# Patient Record
Sex: Female | Born: 1945 | Race: White | Hispanic: No | Marital: Single | Smoking: Never smoker
Health system: Southern US, Community
[De-identification: ages and names within clinical notes are randomized; demographics above are authoritative.]

## PROBLEM LIST (undated history)

## (undated) DIAGNOSIS — I82409 Acute embolism and thrombosis of unspecified deep veins of unspecified lower extremity: Secondary | ICD-10-CM

## (undated) DIAGNOSIS — D689 Coagulation defect, unspecified: Secondary | ICD-10-CM

## (undated) DIAGNOSIS — E785 Hyperlipidemia, unspecified: Secondary | ICD-10-CM

## (undated) DIAGNOSIS — T7840XA Allergy, unspecified, initial encounter: Secondary | ICD-10-CM

## (undated) DIAGNOSIS — M1990 Unspecified osteoarthritis, unspecified site: Secondary | ICD-10-CM

## (undated) HISTORY — DX: Allergy, unspecified, initial encounter: T78.40XA

## (undated) HISTORY — PX: TONSILLECTOMY: SUR1361

## (undated) HISTORY — DX: Unspecified osteoarthritis, unspecified site: M19.90

## (undated) HISTORY — DX: Hyperlipidemia, unspecified: E78.5

## (undated) HISTORY — PX: WRIST FRACTURE SURGERY: SHX121

## (undated) HISTORY — DX: Coagulation defect, unspecified: D68.9

## (undated) HISTORY — DX: Acute embolism and thrombosis of unspecified deep veins of unspecified lower extremity: I82.409

---

## 2000-01-01 ENCOUNTER — Other Ambulatory Visit: Admission: RE | Admit: 2000-01-01 | Discharge: 2000-01-01 | Payer: Self-pay | Admitting: Family Medicine

## 2000-01-09 ENCOUNTER — Encounter: Admission: RE | Admit: 2000-01-09 | Discharge: 2000-01-09 | Payer: Self-pay | Admitting: Family Medicine

## 2000-01-09 ENCOUNTER — Encounter: Payer: Self-pay | Admitting: Family Medicine

## 2000-01-12 ENCOUNTER — Encounter: Payer: Self-pay | Admitting: Family Medicine

## 2000-01-12 ENCOUNTER — Encounter: Admission: RE | Admit: 2000-01-12 | Discharge: 2000-01-12 | Payer: Self-pay | Admitting: Family Medicine

## 2000-07-22 ENCOUNTER — Encounter: Admission: RE | Admit: 2000-07-22 | Discharge: 2000-07-22 | Payer: Self-pay | Admitting: Family Medicine

## 2000-07-22 ENCOUNTER — Encounter: Payer: Self-pay | Admitting: Family Medicine

## 2001-02-11 ENCOUNTER — Other Ambulatory Visit: Admission: RE | Admit: 2001-02-11 | Discharge: 2001-02-11 | Payer: Self-pay | Admitting: Family Medicine

## 2001-08-21 ENCOUNTER — Encounter: Admission: RE | Admit: 2001-08-21 | Discharge: 2001-08-21 | Payer: Self-pay | Admitting: Family Medicine

## 2001-08-21 ENCOUNTER — Encounter: Payer: Self-pay | Admitting: Family Medicine

## 2002-02-17 ENCOUNTER — Other Ambulatory Visit: Admission: RE | Admit: 2002-02-17 | Discharge: 2002-02-17 | Payer: Self-pay | Admitting: Family Medicine

## 2002-07-29 ENCOUNTER — Encounter: Admission: RE | Admit: 2002-07-29 | Discharge: 2002-07-29 | Payer: Self-pay | Admitting: Family Medicine

## 2002-07-29 ENCOUNTER — Encounter: Payer: Self-pay | Admitting: Family Medicine

## 2003-03-12 ENCOUNTER — Other Ambulatory Visit: Admission: RE | Admit: 2003-03-12 | Discharge: 2003-03-12 | Payer: Self-pay | Admitting: Family Medicine

## 2003-04-12 ENCOUNTER — Encounter: Admission: RE | Admit: 2003-04-12 | Discharge: 2003-04-12 | Payer: Self-pay | Admitting: Family Medicine

## 2003-04-12 ENCOUNTER — Encounter: Payer: Self-pay | Admitting: Family Medicine

## 2004-05-11 ENCOUNTER — Encounter: Admission: RE | Admit: 2004-05-11 | Discharge: 2004-05-11 | Payer: Self-pay | Admitting: Family Medicine

## 2005-05-16 ENCOUNTER — Encounter: Admission: RE | Admit: 2005-05-16 | Discharge: 2005-05-16 | Payer: Self-pay | Admitting: Family Medicine

## 2005-12-11 ENCOUNTER — Other Ambulatory Visit: Admission: RE | Admit: 2005-12-11 | Discharge: 2005-12-11 | Payer: Self-pay | Admitting: Family Medicine

## 2006-05-21 ENCOUNTER — Encounter: Admission: RE | Admit: 2006-05-21 | Discharge: 2006-05-21 | Payer: Self-pay | Admitting: Family Medicine

## 2006-06-25 HISTORY — PX: COLONOSCOPY: SHX174

## 2007-01-06 ENCOUNTER — Other Ambulatory Visit: Admission: RE | Admit: 2007-01-06 | Discharge: 2007-01-06 | Payer: Self-pay | Admitting: Family Medicine

## 2007-02-03 ENCOUNTER — Ambulatory Visit: Payer: Self-pay | Admitting: Internal Medicine

## 2007-02-17 ENCOUNTER — Encounter: Payer: Self-pay | Admitting: Internal Medicine

## 2007-02-17 ENCOUNTER — Ambulatory Visit: Payer: Self-pay | Admitting: Internal Medicine

## 2007-05-27 ENCOUNTER — Encounter: Admission: RE | Admit: 2007-05-27 | Discharge: 2007-05-27 | Payer: Self-pay | Admitting: Family Medicine

## 2008-04-16 ENCOUNTER — Other Ambulatory Visit: Admission: RE | Admit: 2008-04-16 | Discharge: 2008-04-16 | Payer: Self-pay | Admitting: Family Medicine

## 2008-05-27 ENCOUNTER — Encounter: Admission: RE | Admit: 2008-05-27 | Discharge: 2008-05-27 | Payer: Self-pay | Admitting: Family Medicine

## 2008-06-25 DIAGNOSIS — I82409 Acute embolism and thrombosis of unspecified deep veins of unspecified lower extremity: Secondary | ICD-10-CM

## 2008-06-25 HISTORY — DX: Acute embolism and thrombosis of unspecified deep veins of unspecified lower extremity: I82.409

## 2008-09-06 ENCOUNTER — Encounter: Admission: RE | Admit: 2008-09-06 | Discharge: 2008-09-06 | Payer: Self-pay | Admitting: Family Medicine

## 2009-03-06 ENCOUNTER — Encounter (INDEPENDENT_AMBULATORY_CARE_PROVIDER_SITE_OTHER): Payer: Self-pay | Admitting: Emergency Medicine

## 2009-03-06 ENCOUNTER — Inpatient Hospital Stay (HOSPITAL_COMMUNITY): Admission: EM | Admit: 2009-03-06 | Discharge: 2009-03-12 | Payer: Self-pay | Admitting: Emergency Medicine

## 2009-03-06 ENCOUNTER — Ambulatory Visit: Payer: Self-pay | Admitting: Vascular Surgery

## 2009-03-14 ENCOUNTER — Ambulatory Visit: Payer: Self-pay | Admitting: Internal Medicine

## 2009-03-15 DIAGNOSIS — E785 Hyperlipidemia, unspecified: Secondary | ICD-10-CM | POA: Insufficient documentation

## 2009-04-12 LAB — CBC WITH DIFFERENTIAL/PLATELET
BASO%: 0.4 % (ref 0.0–2.0)
EOS%: 2.6 % (ref 0.0–7.0)
HCT: 38.7 % (ref 34.8–46.6)
HGB: 13.1 g/dL (ref 11.6–15.9)
MCH: 31.9 pg (ref 25.1–34.0)
MCHC: 34 g/dL (ref 31.5–36.0)
MONO#: 0.5 10*3/uL (ref 0.1–0.9)
NEUT#: 4.9 10*3/uL (ref 1.5–6.5)
NEUT%: 65.5 % (ref 38.4–76.8)
RBC: 4.12 10*6/uL (ref 3.70–5.45)
lymph#: 1.8 10*3/uL (ref 0.9–3.3)

## 2009-04-12 LAB — COMPREHENSIVE METABOLIC PANEL
ALT: 25 U/L (ref 0–35)
Alkaline Phosphatase: 64 U/L (ref 39–117)
CO2: 22 mEq/L (ref 19–32)
Creatinine, Ser: 0.75 mg/dL (ref 0.40–1.20)
Glucose, Bld: 96 mg/dL (ref 70–99)
Sodium: 141 mEq/L (ref 135–145)
Total Bilirubin: 0.5 mg/dL (ref 0.3–1.2)

## 2009-04-12 LAB — PROTIME-INR: Protime: 37.2 Seconds — ABNORMAL HIGH (ref 10.6–13.4)

## 2009-04-12 LAB — APTT: aPTT: 35 seconds (ref 24–37)

## 2009-04-14 LAB — URINE CULTURE

## 2009-04-18 LAB — HYPERCOAGULABLE PANEL, COMPREHENSIVE RET.
Anticardiolipin IgA: <3 APL U/mL (ref <10)
Anticardiolipin IgG: 6 GPL U/mL (ref <10)
Anticardiolipin IgM: <10 MPL U/mL (ref <10)
Beta-2 Glyco I IgG: 13 U/mL (ref <=15)
Beta-2-Glycoprotein I IgA: <3 U/mL (ref <=15)
Beta-2-Glycoprotein I IgM: 61 U/mL — ABNORMAL HIGH (ref <=15)
PTT Lupus Anticoagulant: 57.7 secs — ABNORMAL HIGH (ref 32.0–43.4)
PTTLA 4:1 Mix: 45.7 secs (ref 36.3–48.8)
PTTLA Confirmation: 0 secs (ref <8.0)

## 2009-05-05 ENCOUNTER — Ambulatory Visit: Payer: Self-pay | Admitting: Internal Medicine

## 2009-05-30 ENCOUNTER — Encounter: Admission: RE | Admit: 2009-05-30 | Discharge: 2009-05-30 | Payer: Self-pay | Admitting: Family Medicine

## 2009-08-18 DIAGNOSIS — M899 Disorder of bone, unspecified: Secondary | ICD-10-CM | POA: Insufficient documentation

## 2010-05-31 ENCOUNTER — Encounter
Admission: RE | Admit: 2010-05-31 | Discharge: 2010-05-31 | Payer: Self-pay | Source: Home / Self Care | Admitting: Internal Medicine

## 2010-09-29 LAB — HEPARIN LEVEL (UNFRACTIONATED)
Heparin Unfractionated: 0.41 IU/mL (ref 0.30–0.70)
Heparin Unfractionated: <0.1 IU/mL — ABNORMAL LOW (ref 0.30–0.70)
Heparin Unfractionated: 0.25 IU/mL — ABNORMAL LOW (ref 0.30–0.70)

## 2010-09-29 LAB — COMPREHENSIVE METABOLIC PANEL
ALT: 19 U/L (ref 0–35)
Alkaline Phosphatase: 58 U/L (ref 39–117)
CO2: 28 mEq/L (ref 19–32)
GFR calc non Af Amer: >60 mL/min (ref >60)
Glucose, Bld: 107 mg/dL — ABNORMAL HIGH (ref 70–99)
Potassium: 3.6 mEq/L (ref 3.5–5.1)
Sodium: 137 mEq/L (ref 135–145)
Total Protein: 6.4 g/dL (ref 6.0–8.3)

## 2010-09-29 LAB — BASIC METABOLIC PANEL
BUN: 11 mg/dL (ref 6–23)
CO2: 22 mEq/L (ref 19–32)
Calcium: 7.4 mg/dL — ABNORMAL LOW (ref 8.4–10.5)
Chloride: 106 mEq/L (ref 96–112)
GFR calc Af Amer: >60 mL/min (ref >60)
GFR calc Af Amer: >60 mL/min (ref >60)
Potassium: 3.6 mEq/L (ref 3.5–5.1)
Potassium: 4.3 mEq/L (ref 3.5–5.1)
Sodium: 135 mEq/L (ref 135–145)

## 2010-09-29 LAB — PROTIME-INR
INR: 1.3 (ref 0.00–1.49)
Prothrombin Time: 15.8 seconds — ABNORMAL HIGH (ref 11.6–15.2)
Prothrombin Time: 17.3 seconds — ABNORMAL HIGH (ref 11.6–15.2)

## 2010-09-29 LAB — CBC
HCT: 30.8 % — ABNORMAL LOW (ref 36.0–46.0)
HCT: 32.2 % — ABNORMAL LOW (ref 36.0–46.0)
HCT: 32.4 % — ABNORMAL LOW (ref 36.0–46.0)
HCT: 37.3 % (ref 36.0–46.0)
HCT: 37.3 % (ref 36.0–46.0)
HCT: 37.9 % (ref 36.0–46.0)
Hemoglobin: 10.8 g/dL — ABNORMAL LOW (ref 12.0–15.0)
Hemoglobin: 11 g/dL — ABNORMAL LOW (ref 12.0–15.0)
MCHC: 33.7 g/dL (ref 30.0–36.0)
MCHC: 34.5 g/dL (ref 30.0–36.0)
MCHC: 34.1 g/dL (ref 30.0–36.0)
MCV: 94.2 fL (ref 78.0–100.0)
MCV: 94.1 fL (ref 78.0–100.0)
MCV: 94.7 fL (ref 78.0–100.0)
Platelets: 200 10*3/uL (ref 150–400)
Platelets: 166 10*3/uL (ref 150–400)
Platelets: 185 10*3/uL (ref 150–400)
Platelets: 200 10*3/uL (ref 150–400)
RBC: 3.17 MIL/uL — ABNORMAL LOW (ref 3.87–5.11)
RBC: 3.43 MIL/uL — ABNORMAL LOW (ref 3.87–5.11)
RBC: 3.96 MIL/uL (ref 3.87–5.11)
RDW: 13 % (ref 11.5–15.5)
WBC: 5.6 10*3/uL (ref 4.0–10.5)
WBC: 6.5 10*3/uL (ref 4.0–10.5)
WBC: 13.4 10*3/uL — ABNORMAL HIGH (ref 4.0–10.5)
WBC: 6.7 10*3/uL (ref 4.0–10.5)
WBC: 8.1 10*3/uL (ref 4.0–10.5)
WBC: 8.4 10*3/uL (ref 4.0–10.5)

## 2010-09-29 LAB — DIFFERENTIAL
Eosinophils Absolute: 0 10*3/uL (ref 0.0–0.7)
Eosinophils Relative: 0 % (ref 0–5)
Eosinophils Relative: 4 % (ref 0–5)
Lymphocytes Relative: 23 % (ref 12–46)
Lymphs Abs: 1.2 10*3/uL (ref 0.7–4.0)
Lymphs Abs: 1.5 10*3/uL (ref 0.7–4.0)
Monocytes Absolute: 0.7 10*3/uL (ref 0.1–1.0)
Monocytes Relative: 10 % (ref 3–12)
Monocytes Relative: 8 % (ref 3–12)

## 2010-09-29 LAB — FACTOR 5 LEIDEN

## 2010-09-29 LAB — D-DIMER, QUANTITATIVE: D-Dimer, Quant: 8.89 ug/mL-FEU — ABNORMAL HIGH (ref 0.00–0.48)

## 2010-09-29 LAB — HOMOCYSTEINE: Homocysteine: 5.9 umol/L (ref 4.0–15.4)

## 2011-04-27 ENCOUNTER — Other Ambulatory Visit: Payer: Self-pay | Admitting: Internal Medicine

## 2011-04-27 DIAGNOSIS — Z1231 Encounter for screening mammogram for malignant neoplasm of breast: Secondary | ICD-10-CM

## 2011-06-05 ENCOUNTER — Ambulatory Visit
Admission: RE | Admit: 2011-06-05 | Discharge: 2011-06-05 | Disposition: A | Discharge status: Home / Self Care | Payer: 59 | Source: Ambulatory Visit | Attending: Internal Medicine | Admitting: Internal Medicine

## 2011-06-05 DIAGNOSIS — Z1231 Encounter for screening mammogram for malignant neoplasm of breast: Secondary | ICD-10-CM

## 2012-05-06 ENCOUNTER — Other Ambulatory Visit: Payer: Self-pay | Admitting: Internal Medicine

## 2012-05-06 DIAGNOSIS — Z1231 Encounter for screening mammogram for malignant neoplasm of breast: Secondary | ICD-10-CM

## 2012-06-12 ENCOUNTER — Ambulatory Visit
Admission: RE | Admit: 2012-06-12 | Discharge: 2012-06-12 | Disposition: A | Discharge status: Home / Self Care | Payer: Medicare Other | Source: Ambulatory Visit | Attending: Internal Medicine | Admitting: Internal Medicine

## 2012-06-12 DIAGNOSIS — Z1231 Encounter for screening mammogram for malignant neoplasm of breast: Secondary | ICD-10-CM

## 2013-05-12 ENCOUNTER — Other Ambulatory Visit: Payer: Self-pay

## 2013-05-12 DIAGNOSIS — Z1231 Encounter for screening mammogram for malignant neoplasm of breast: Secondary | ICD-10-CM

## 2013-06-16 ENCOUNTER — Ambulatory Visit
Admission: RE | Admit: 2013-06-16 | Discharge: 2013-06-16 | Disposition: A | Discharge status: Home / Self Care | Payer: Medicare Other | Source: Ambulatory Visit

## 2013-06-16 DIAGNOSIS — Z1231 Encounter for screening mammogram for malignant neoplasm of breast: Secondary | ICD-10-CM

## 2013-09-15 DIAGNOSIS — M79672 Pain in left foot: Secondary | ICD-10-CM | POA: Insufficient documentation

## 2014-05-24 ENCOUNTER — Other Ambulatory Visit: Payer: Self-pay

## 2014-05-24 DIAGNOSIS — Z1231 Encounter for screening mammogram for malignant neoplasm of breast: Secondary | ICD-10-CM

## 2014-06-23 ENCOUNTER — Ambulatory Visit: Payer: Medicare Other

## 2014-06-30 ENCOUNTER — Ambulatory Visit
Admission: RE | Admit: 2014-06-30 | Discharge: 2014-06-30 | Disposition: A | Discharge status: Home / Self Care | Payer: Commercial Managed Care - HMO | Source: Ambulatory Visit

## 2014-06-30 DIAGNOSIS — Z1231 Encounter for screening mammogram for malignant neoplasm of breast: Secondary | ICD-10-CM | POA: Diagnosis not present

## 2014-09-27 DIAGNOSIS — L237 Allergic contact dermatitis due to plants, except food: Secondary | ICD-10-CM | POA: Diagnosis not present

## 2014-09-27 DIAGNOSIS — Z6825 Body mass index (BMI) 25.0-25.9, adult: Secondary | ICD-10-CM | POA: Diagnosis not present

## 2015-02-18 DIAGNOSIS — Z23 Encounter for immunization: Secondary | ICD-10-CM | POA: Diagnosis not present

## 2015-02-18 DIAGNOSIS — Z1389 Encounter for screening for other disorder: Secondary | ICD-10-CM | POA: Diagnosis not present

## 2015-02-18 DIAGNOSIS — E785 Hyperlipidemia, unspecified: Secondary | ICD-10-CM | POA: Diagnosis not present

## 2015-02-18 DIAGNOSIS — R7301 Impaired fasting glucose: Secondary | ICD-10-CM | POA: Diagnosis not present

## 2015-02-18 DIAGNOSIS — M859 Disorder of bone density and structure, unspecified: Secondary | ICD-10-CM | POA: Diagnosis not present

## 2015-02-18 DIAGNOSIS — Z6826 Body mass index (BMI) 26.0-26.9, adult: Secondary | ICD-10-CM | POA: Diagnosis not present

## 2015-02-18 DIAGNOSIS — R312 Other microscopic hematuria: Secondary | ICD-10-CM | POA: Diagnosis not present

## 2015-02-18 DIAGNOSIS — Z8672 Personal history of thrombophlebitis: Secondary | ICD-10-CM | POA: Diagnosis not present

## 2015-06-14 ENCOUNTER — Other Ambulatory Visit: Payer: Self-pay

## 2015-06-14 DIAGNOSIS — Z1231 Encounter for screening mammogram for malignant neoplasm of breast: Secondary | ICD-10-CM

## 2015-07-06 ENCOUNTER — Ambulatory Visit
Admission: RE | Admit: 2015-07-06 | Discharge: 2015-07-06 | Disposition: A | Discharge status: Home / Self Care | Payer: Commercial Managed Care - HMO | Source: Ambulatory Visit

## 2015-07-06 DIAGNOSIS — Z1231 Encounter for screening mammogram for malignant neoplasm of breast: Secondary | ICD-10-CM

## 2016-02-27 ENCOUNTER — Encounter (HOSPITAL_COMMUNITY): Payer: Self-pay | Admitting: Emergency Medicine

## 2016-02-27 ENCOUNTER — Emergency Department (HOSPITAL_COMMUNITY)
Admission: EM | Admit: 2016-02-27 | Discharge: 2016-02-27 | Disposition: A | Discharge status: Home / Self Care | Payer: Commercial Managed Care - HMO | Attending: Emergency Medicine | Admitting: Emergency Medicine

## 2016-02-27 ENCOUNTER — Emergency Department (HOSPITAL_COMMUNITY): Payer: Commercial Managed Care - HMO

## 2016-02-27 DIAGNOSIS — Y999 Unspecified external cause status: Secondary | ICD-10-CM | POA: Insufficient documentation

## 2016-02-27 DIAGNOSIS — W11XXXA Fall on and from ladder, initial encounter: Secondary | ICD-10-CM | POA: Diagnosis not present

## 2016-02-27 DIAGNOSIS — S6992XA Unspecified injury of left wrist, hand and finger(s), initial encounter: Secondary | ICD-10-CM | POA: Diagnosis present

## 2016-02-27 DIAGNOSIS — Y939 Activity, unspecified: Secondary | ICD-10-CM | POA: Diagnosis not present

## 2016-02-27 DIAGNOSIS — S52572A Other intraarticular fracture of lower end of left radius, initial encounter for closed fracture: Secondary | ICD-10-CM | POA: Diagnosis not present

## 2016-02-27 DIAGNOSIS — Y929 Unspecified place or not applicable: Secondary | ICD-10-CM | POA: Insufficient documentation

## 2016-02-27 DIAGNOSIS — S52502A Unspecified fracture of the lower end of left radius, initial encounter for closed fracture: Secondary | ICD-10-CM | POA: Insufficient documentation

## 2016-02-27 MED ORDER — FENTANYL CITRATE (PF) 100 MCG/2ML IJ SOLN
100.0000 ug | Freq: Once | INTRAMUSCULAR | Status: DC
  Filled 2016-02-27: qty 2

## 2016-02-27 MED ORDER — LIDOCAINE-EPINEPHRINE 1 %-1:100000 IJ SOLN
20.0000 mL | Freq: Once | INTRAMUSCULAR | Status: AC
  Administered 2016-02-27: 20 mL via INTRADERMAL

## 2016-02-27 MED ORDER — FENTANYL CITRATE (PF) 100 MCG/2ML IJ SOLN
50.0000 ug | Freq: Once | INTRAMUSCULAR | Status: AC
  Administered 2016-02-27: 50 ug via INTRAVENOUS
  Filled 2016-02-27: qty 2

## 2016-02-27 MED ORDER — MORPHINE SULFATE 15 MG PO TABS: 15.0000 mg | ORAL_TABLET | ORAL | 0 refills | Status: DC | PRN

## 2016-02-27 MED ORDER — ONDANSETRON HCL 4 MG/2ML IJ SOLN
4.0000 mg | Freq: Once | INTRAMUSCULAR | Status: AC
  Administered 2016-02-27: 4 mg via INTRAVENOUS
  Filled 2016-02-27: qty 2

## 2016-02-27 NOTE — ED Triage Notes (Signed)
Pt complaint of fall resulting in left wrist injury within an hour.

## 2016-02-27 NOTE — Discharge Instructions (Signed)

## 2016-02-27 NOTE — ED Notes (Signed)
Ortho tech applied finger traps with EDP input.  EDP requests hold to Fentanyl at this time.  RN will continue to assess for need.

## 2016-02-27 NOTE — ED Provider Notes (Signed)
[facility] DEPT Provider Note   CSN: [id number] Arrival date & time: 02/27/16  1157     History   Chief Complaint Chief Complaint  Patient presents with  . Fall  . Wrist Injury    HPI Stephanie Saunders is a 70 y.o. female.  70 yo F with a chief complaint of left wrist pain. This after she fell 2 steps off of ladder. Mechanical fall. Denies syncope. Denies headache. Denies chest pain shortness breath abdominal pain back pain. Pain to the left wrist worse with movement palpation. Throbbing sharp pain. Denies radiation.   The history is provided by the patient and a relative.  Fall  This is a new problem. The current episode started 1 to 2 hours ago. The problem occurs constantly. The problem has not changed since onset.Pertinent negatives include no chest pain, no headaches and no shortness of breath. The symptoms are aggravated by bending and twisting. Nothing relieves the symptoms. She has tried nothing for the symptoms. The treatment provided no relief.  Wrist Injury   Pertinent negatives include no fever.    Past Medical History:  Diagnosis Date  . DVT (deep vein thrombosis) in pregnancy     There are no active problems to display for this patient.   History reviewed. No pertinent surgical history.  OB History    No data available       Home Medications    Prior to Admission medications   Medication Sig Start Date End Date Taking? Authorizing Provider  morphine (MSIR) 15 MG tablet Take 1 tablet (15 mg total) by mouth every 4 (four) hours as needed for severe pain. 02/27/16   Deno Etienne, DO    Family History No family history on file.  Social History Social History  Substance Use Topics  . Smoking status: Never Smoker  . Smokeless tobacco: Not on file  . Alcohol use No     Allergies   Review of patient's allergies indicates no known allergies.   Review of Systems Review of Systems  Constitutional: Negative for chills and fever.  HENT: Negative for  congestion and rhinorrhea.   Eyes: Negative for redness and visual disturbance.  Respiratory: Negative for shortness of breath and wheezing.   Cardiovascular: Negative for chest pain and palpitations.  Gastrointestinal: Negative for nausea and vomiting.  Genitourinary: Negative for dysuria and urgency.  Musculoskeletal: Positive for arthralgias and myalgias.  Skin: Negative for pallor and wound.  Neurological: Negative for dizziness and headaches.     Physical Exam Updated Vital Signs BP 131/100 (BP Location: Right Arm)   Pulse 96   Temp 97.4 F (36.3 C) (Oral)   Resp 18   Ht 5\' 7"  (1.702 m)   Wt 160 lb (72.6 kg)   SpO2 96%   BMI 25.06 kg/m   Physical Exam  Constitutional: She is oriented to person, place, and time. She appears well-developed and well-nourished. No distress.  HENT:  Head: Normocephalic and atraumatic.  Eyes: EOM are normal. Pupils are equal, round, and reactive to light.  Neck: Normal range of motion. Neck supple.  Cardiovascular: Normal rate and regular rhythm.  Exam reveals no gallop and no friction rub.   No murmur heard. Pulmonary/Chest: Effort normal. She has no wheezes. She has no rales.  Abdominal: Soft. She exhibits no distension. There is no tenderness.  Musculoskeletal: She exhibits tenderness and deformity. She exhibits no edema.  Dorsally displaced distal radius.  PMS intact distally  Neurological: She is alert and oriented to person,  place, and time.  Skin: Skin is warm and dry. She is not diaphoretic.  Psychiatric: She has a normal mood and affect. Her behavior is normal.  Nursing note and vitals reviewed.    ED Treatments / Results  Labs (all labs ordered are listed, but only abnormal results are displayed) Labs Reviewed - No data to display  EKG  EKG Interpretation None       Radiology Dg Wrist Complete Left  Result Date: 02/27/2016 CLINICAL DATA:  Post reduction of LEFT wrist EXAM: LEFT WRIST - COMPLETE 3+ VIEW COMPARISON:   Earlier study of 02/27/2016 FINDINGS: Fiberglass cast now present. Nondisplaced ulnar styloid fracture. Intra-articular fracture of the distal LEFT radial metaphysis with persistent dorsal tilt of the distal radial articular surface and mild dorsal displacement. Alignment appears little changed. No new focal bony abnormalities identified. IMPRESSION: Little interval change in appearance of previously identified ulnar styloid and displaced intra-articular distal LEFT radial metaphyseal fractures. Electronically Signed   By: Lavonia Dana M.D.   On: 02/27/2016 14:39   Dg Wrist Complete Left  Result Date: 02/27/2016 CLINICAL DATA:  Fall off ladder, landing on left wrist, severe left wrist pain, obvious deformity EXAM: LEFT WRIST - COMPLETE 3+ VIEW COMPARISON:  None. FINDINGS: Note: Study incorrectly marked with the "RIGHT" marker. However, the tech and referring physician confirmed that the LEFT wrist was imaged prior to completing the study. Comminuted, intra-articular distal radial fracture. Mild impaction. Apex volar angulation. Suspected nondisplaced ulnar styloid fracture. Associated soft tissue swelling. IMPRESSION: Comminuted, intra-articular distal radial fracture, as described above. Suspected nondisplaced ulnar styloid fracture. Electronically Signed   By: Julian Hy M.D.   On: 02/27/2016 12:56    Procedures Reduction of fracture Date/Time: 02/27/2016 2:52 PM Performed by: Tyrone Nine Cinnamon Morency Authorized by: Deno Etienne  Consent: Verbal consent obtained. Risks and benefits: risks, benefits and alternatives were discussed Consent given by: patient Patient understanding: patient states understanding of the procedure being performed Patient consent: the patient's understanding of the procedure matches consent given Imaging studies: imaging studies available Required items: required blood products, implants, devices, and special equipment available Patient identity confirmed: verbally with patient Time  out: Immediately prior to procedure a "time out" was called to verify the correct patient, procedure, equipment, support staff and site/side marked as required. Local anesthesia used: yes Anesthesia: hematoma block  Anesthesia: Local anesthesia used: yes Local Anesthetic: lidocaine 1% with epinephrine Anesthetic total: 8 mL  Sedation: Patient sedated: no Patient tolerance: Patient tolerated the procedure well with no immediate complications    (including critical care time)  Medications Ordered in ED Medications  fentaNYL (SUBLIMAZE) injection 100 mcg (not administered)  fentaNYL (SUBLIMAZE) injection 50 mcg (50 mcg Intravenous Given 02/27/16 1321)  ondansetron (ZOFRAN) injection 4 mg (4 mg Intravenous Given 02/27/16 1322)  lidocaine-EPINEPHrine (XYLOCAINE W/EPI) 1 %-1:100000 (with pres) injection 20 mL (20 mLs Intradermal Given 02/27/16 1315)     Initial Impression / Assessment and Plan / ED Course  I have reviewed the triage vital signs and the nursing notes.  Pertinent labs & imaging results that were available during my care of the patient were reviewed by me and considered in my medical decision making (see chart for details).  Clinical Course    70 yo F With a chief complaint of left wrist pain. Patient has a distal radius fracture with impaction and displacement. Patient was having significant pain improved with hematoma block. Patient was then placed in finger traps with significant improvement of alignment. She was splinted  in place x-ray with mild improvement. Hand follow-up.  3:00 PM:  I have discussed the diagnosis/risks/treatment options with the patient and family and believe the pt to be eligible for discharge home to follow-up with PCP. We also discussed returning to the ED immediately if new or worsening sx occur. We discussed the sx which are most concerning (e.g., sudden worsening pain, fever, inability to tolerate by mouth) that necessitate immediate return.  Medications administered to the patient during their visit and any new prescriptions provided to the patient are listed below.  Medications given during this visit Medications  fentaNYL (SUBLIMAZE) injection 100 mcg (not administered)  fentaNYL (SUBLIMAZE) injection 50 mcg (50 mcg Intravenous Given 02/27/16 1321)  ondansetron (ZOFRAN) injection 4 mg (4 mg Intravenous Given 02/27/16 1322)  lidocaine-EPINEPHrine (XYLOCAINE W/EPI) 1 %-1:100000 (with pres) injection 20 mL (20 mLs Intradermal Given 02/27/16 1315)     The patient appears reasonably screen and/or stabilized for discharge and I doubt any other medical condition or other [facility] requiring further screening, evaluation, or treatment in the ED at this time prior to discharge.    Final Clinical Impressions(s) / ED Diagnoses   Final diagnoses:  Distal radius fracture, left, closed, initial encounter    New Prescriptions New Prescriptions   MORPHINE (MSIR) 15 MG TABLET    Take 1 tablet (15 mg total) by mouth every 4 (four) hours as needed for severe pain.     Deno Etienne, DO 02/27/16 1500

## 2016-03-02 DIAGNOSIS — S52502A Unspecified fracture of the lower end of left radius, initial encounter for closed fracture: Secondary | ICD-10-CM | POA: Diagnosis not present

## 2016-03-06 DIAGNOSIS — S52502A Unspecified fracture of the lower end of left radius, initial encounter for closed fracture: Secondary | ICD-10-CM | POA: Diagnosis not present

## 2016-03-06 DIAGNOSIS — G8918 Other acute postprocedural pain: Secondary | ICD-10-CM | POA: Diagnosis not present

## 2016-03-06 DIAGNOSIS — S52572A Other intraarticular fracture of lower end of left radius, initial encounter for closed fracture: Secondary | ICD-10-CM | POA: Diagnosis not present

## 2016-03-06 DIAGNOSIS — S52532A Colles' fracture of left radius, initial encounter for closed fracture: Secondary | ICD-10-CM | POA: Diagnosis not present

## 2016-03-16 DIAGNOSIS — S52502D Unspecified fracture of the lower end of left radius, subsequent encounter for closed fracture with routine healing: Secondary | ICD-10-CM | POA: Diagnosis not present

## 2016-04-04 DIAGNOSIS — S52502D Unspecified fracture of the lower end of left radius, subsequent encounter for closed fracture with routine healing: Secondary | ICD-10-CM | POA: Diagnosis not present

## 2016-04-10 DIAGNOSIS — M25632 Stiffness of left wrist, not elsewhere classified: Secondary | ICD-10-CM | POA: Diagnosis not present

## 2016-04-10 DIAGNOSIS — S52502D Unspecified fracture of the lower end of left radius, subsequent encounter for closed fracture with routine healing: Secondary | ICD-10-CM | POA: Diagnosis not present

## 2016-04-10 DIAGNOSIS — M25532 Pain in left wrist: Secondary | ICD-10-CM | POA: Diagnosis not present

## 2016-04-10 DIAGNOSIS — M6281 Muscle weakness (generalized): Secondary | ICD-10-CM | POA: Diagnosis not present

## 2016-04-18 DIAGNOSIS — M25532 Pain in left wrist: Secondary | ICD-10-CM | POA: Diagnosis not present

## 2016-04-18 DIAGNOSIS — M6281 Muscle weakness (generalized): Secondary | ICD-10-CM | POA: Diagnosis not present

## 2016-04-18 DIAGNOSIS — S52502D Unspecified fracture of the lower end of left radius, subsequent encounter for closed fracture with routine healing: Secondary | ICD-10-CM | POA: Diagnosis not present

## 2016-04-18 DIAGNOSIS — M25632 Stiffness of left wrist, not elsewhere classified: Secondary | ICD-10-CM | POA: Diagnosis not present

## 2016-04-20 DIAGNOSIS — S52502D Unspecified fracture of the lower end of left radius, subsequent encounter for closed fracture with routine healing: Secondary | ICD-10-CM | POA: Diagnosis not present

## 2016-04-20 DIAGNOSIS — M25532 Pain in left wrist: Secondary | ICD-10-CM | POA: Diagnosis not present

## 2016-04-20 DIAGNOSIS — M25632 Stiffness of left wrist, not elsewhere classified: Secondary | ICD-10-CM | POA: Diagnosis not present

## 2016-04-20 DIAGNOSIS — M6281 Muscle weakness (generalized): Secondary | ICD-10-CM | POA: Diagnosis not present

## 2016-04-21 DIAGNOSIS — Z23 Encounter for immunization: Secondary | ICD-10-CM | POA: Diagnosis not present

## 2016-04-25 DIAGNOSIS — M6281 Muscle weakness (generalized): Secondary | ICD-10-CM | POA: Diagnosis not present

## 2016-04-25 DIAGNOSIS — M25532 Pain in left wrist: Secondary | ICD-10-CM | POA: Diagnosis not present

## 2016-04-25 DIAGNOSIS — M25632 Stiffness of left wrist, not elsewhere classified: Secondary | ICD-10-CM | POA: Diagnosis not present

## 2016-04-25 DIAGNOSIS — S52502D Unspecified fracture of the lower end of left radius, subsequent encounter for closed fracture with routine healing: Secondary | ICD-10-CM | POA: Diagnosis not present

## 2016-04-27 DIAGNOSIS — M6281 Muscle weakness (generalized): Secondary | ICD-10-CM | POA: Diagnosis not present

## 2016-04-27 DIAGNOSIS — S52502D Unspecified fracture of the lower end of left radius, subsequent encounter for closed fracture with routine healing: Secondary | ICD-10-CM | POA: Diagnosis not present

## 2016-04-27 DIAGNOSIS — M25632 Stiffness of left wrist, not elsewhere classified: Secondary | ICD-10-CM | POA: Diagnosis not present

## 2016-04-27 DIAGNOSIS — M25532 Pain in left wrist: Secondary | ICD-10-CM | POA: Diagnosis not present

## 2016-04-30 DIAGNOSIS — M6281 Muscle weakness (generalized): Secondary | ICD-10-CM | POA: Diagnosis not present

## 2016-04-30 DIAGNOSIS — M25532 Pain in left wrist: Secondary | ICD-10-CM | POA: Diagnosis not present

## 2016-04-30 DIAGNOSIS — M25632 Stiffness of left wrist, not elsewhere classified: Secondary | ICD-10-CM | POA: Diagnosis not present

## 2016-04-30 DIAGNOSIS — S52502D Unspecified fracture of the lower end of left radius, subsequent encounter for closed fracture with routine healing: Secondary | ICD-10-CM | POA: Diagnosis not present

## 2016-05-02 DIAGNOSIS — S52502D Unspecified fracture of the lower end of left radius, subsequent encounter for closed fracture with routine healing: Secondary | ICD-10-CM | POA: Diagnosis not present

## 2016-05-02 DIAGNOSIS — M6281 Muscle weakness (generalized): Secondary | ICD-10-CM | POA: Diagnosis not present

## 2016-05-02 DIAGNOSIS — M25632 Stiffness of left wrist, not elsewhere classified: Secondary | ICD-10-CM | POA: Diagnosis not present

## 2016-05-02 DIAGNOSIS — M25532 Pain in left wrist: Secondary | ICD-10-CM | POA: Diagnosis not present

## 2016-05-04 DIAGNOSIS — M25532 Pain in left wrist: Secondary | ICD-10-CM | POA: Diagnosis not present

## 2016-05-04 DIAGNOSIS — M6281 Muscle weakness (generalized): Secondary | ICD-10-CM | POA: Diagnosis not present

## 2016-05-04 DIAGNOSIS — S52502D Unspecified fracture of the lower end of left radius, subsequent encounter for closed fracture with routine healing: Secondary | ICD-10-CM | POA: Diagnosis not present

## 2016-05-04 DIAGNOSIS — M25632 Stiffness of left wrist, not elsewhere classified: Secondary | ICD-10-CM | POA: Diagnosis not present

## 2016-05-07 DIAGNOSIS — M25632 Stiffness of left wrist, not elsewhere classified: Secondary | ICD-10-CM | POA: Diagnosis not present

## 2016-05-07 DIAGNOSIS — M6281 Muscle weakness (generalized): Secondary | ICD-10-CM | POA: Diagnosis not present

## 2016-05-07 DIAGNOSIS — S52502D Unspecified fracture of the lower end of left radius, subsequent encounter for closed fracture with routine healing: Secondary | ICD-10-CM | POA: Diagnosis not present

## 2016-05-07 DIAGNOSIS — M25532 Pain in left wrist: Secondary | ICD-10-CM | POA: Diagnosis not present

## 2016-05-09 DIAGNOSIS — G90512 Complex regional pain syndrome I of left upper limb: Secondary | ICD-10-CM | POA: Diagnosis not present

## 2016-05-10 DIAGNOSIS — S52502D Unspecified fracture of the lower end of left radius, subsequent encounter for closed fracture with routine healing: Secondary | ICD-10-CM | POA: Diagnosis not present

## 2016-05-10 DIAGNOSIS — M25632 Stiffness of left wrist, not elsewhere classified: Secondary | ICD-10-CM | POA: Diagnosis not present

## 2016-05-10 DIAGNOSIS — M6281 Muscle weakness (generalized): Secondary | ICD-10-CM | POA: Diagnosis not present

## 2016-05-10 DIAGNOSIS — M25532 Pain in left wrist: Secondary | ICD-10-CM | POA: Diagnosis not present

## 2016-05-14 DIAGNOSIS — M6281 Muscle weakness (generalized): Secondary | ICD-10-CM | POA: Diagnosis not present

## 2016-05-14 DIAGNOSIS — M25532 Pain in left wrist: Secondary | ICD-10-CM | POA: Diagnosis not present

## 2016-05-14 DIAGNOSIS — M25632 Stiffness of left wrist, not elsewhere classified: Secondary | ICD-10-CM | POA: Diagnosis not present

## 2016-05-14 DIAGNOSIS — S52502D Unspecified fracture of the lower end of left radius, subsequent encounter for closed fracture with routine healing: Secondary | ICD-10-CM | POA: Diagnosis not present

## 2016-05-15 DIAGNOSIS — M6281 Muscle weakness (generalized): Secondary | ICD-10-CM | POA: Diagnosis not present

## 2016-05-15 DIAGNOSIS — S52502D Unspecified fracture of the lower end of left radius, subsequent encounter for closed fracture with routine healing: Secondary | ICD-10-CM | POA: Diagnosis not present

## 2016-05-15 DIAGNOSIS — M25532 Pain in left wrist: Secondary | ICD-10-CM | POA: Diagnosis not present

## 2016-05-15 DIAGNOSIS — M25632 Stiffness of left wrist, not elsewhere classified: Secondary | ICD-10-CM | POA: Diagnosis not present

## 2016-05-23 DIAGNOSIS — M6281 Muscle weakness (generalized): Secondary | ICD-10-CM | POA: Diagnosis not present

## 2016-05-23 DIAGNOSIS — M25632 Stiffness of left wrist, not elsewhere classified: Secondary | ICD-10-CM | POA: Diagnosis not present

## 2016-05-23 DIAGNOSIS — S52502D Unspecified fracture of the lower end of left radius, subsequent encounter for closed fracture with routine healing: Secondary | ICD-10-CM | POA: Diagnosis not present

## 2016-05-23 DIAGNOSIS — M25532 Pain in left wrist: Secondary | ICD-10-CM | POA: Diagnosis not present

## 2016-05-25 DIAGNOSIS — S52502D Unspecified fracture of the lower end of left radius, subsequent encounter for closed fracture with routine healing: Secondary | ICD-10-CM | POA: Diagnosis not present

## 2016-05-25 DIAGNOSIS — M25532 Pain in left wrist: Secondary | ICD-10-CM | POA: Diagnosis not present

## 2016-05-25 DIAGNOSIS — M6281 Muscle weakness (generalized): Secondary | ICD-10-CM | POA: Diagnosis not present

## 2016-05-25 DIAGNOSIS — M25632 Stiffness of left wrist, not elsewhere classified: Secondary | ICD-10-CM | POA: Diagnosis not present

## 2016-05-28 DIAGNOSIS — M6281 Muscle weakness (generalized): Secondary | ICD-10-CM | POA: Diagnosis not present

## 2016-05-28 DIAGNOSIS — S52502D Unspecified fracture of the lower end of left radius, subsequent encounter for closed fracture with routine healing: Secondary | ICD-10-CM | POA: Diagnosis not present

## 2016-05-28 DIAGNOSIS — M25632 Stiffness of left wrist, not elsewhere classified: Secondary | ICD-10-CM | POA: Diagnosis not present

## 2016-05-28 DIAGNOSIS — M25532 Pain in left wrist: Secondary | ICD-10-CM | POA: Diagnosis not present

## 2016-05-30 DIAGNOSIS — M25532 Pain in left wrist: Secondary | ICD-10-CM | POA: Diagnosis not present

## 2016-05-30 DIAGNOSIS — S52502D Unspecified fracture of the lower end of left radius, subsequent encounter for closed fracture with routine healing: Secondary | ICD-10-CM | POA: Diagnosis not present

## 2016-05-30 DIAGNOSIS — M25632 Stiffness of left wrist, not elsewhere classified: Secondary | ICD-10-CM | POA: Diagnosis not present

## 2016-05-30 DIAGNOSIS — M6281 Muscle weakness (generalized): Secondary | ICD-10-CM | POA: Diagnosis not present

## 2016-05-31 DIAGNOSIS — G90512 Complex regional pain syndrome I of left upper limb: Secondary | ICD-10-CM | POA: Diagnosis not present

## 2016-06-01 DIAGNOSIS — M6281 Muscle weakness (generalized): Secondary | ICD-10-CM | POA: Diagnosis not present

## 2016-06-01 DIAGNOSIS — M25532 Pain in left wrist: Secondary | ICD-10-CM | POA: Diagnosis not present

## 2016-06-01 DIAGNOSIS — S52502D Unspecified fracture of the lower end of left radius, subsequent encounter for closed fracture with routine healing: Secondary | ICD-10-CM | POA: Diagnosis not present

## 2016-06-01 DIAGNOSIS — M25632 Stiffness of left wrist, not elsewhere classified: Secondary | ICD-10-CM | POA: Diagnosis not present

## 2016-06-04 DIAGNOSIS — M6281 Muscle weakness (generalized): Secondary | ICD-10-CM | POA: Diagnosis not present

## 2016-06-04 DIAGNOSIS — S52502D Unspecified fracture of the lower end of left radius, subsequent encounter for closed fracture with routine healing: Secondary | ICD-10-CM | POA: Diagnosis not present

## 2016-06-04 DIAGNOSIS — M25532 Pain in left wrist: Secondary | ICD-10-CM | POA: Diagnosis not present

## 2016-06-04 DIAGNOSIS — M25632 Stiffness of left wrist, not elsewhere classified: Secondary | ICD-10-CM | POA: Diagnosis not present

## 2016-06-08 ENCOUNTER — Other Ambulatory Visit: Payer: Self-pay | Admitting: Internal Medicine

## 2016-06-08 DIAGNOSIS — Z1231 Encounter for screening mammogram for malignant neoplasm of breast: Secondary | ICD-10-CM

## 2016-06-08 DIAGNOSIS — S52502D Unspecified fracture of the lower end of left radius, subsequent encounter for closed fracture with routine healing: Secondary | ICD-10-CM | POA: Diagnosis not present

## 2016-06-08 DIAGNOSIS — M25532 Pain in left wrist: Secondary | ICD-10-CM | POA: Diagnosis not present

## 2016-06-08 DIAGNOSIS — M25632 Stiffness of left wrist, not elsewhere classified: Secondary | ICD-10-CM | POA: Diagnosis not present

## 2016-06-08 DIAGNOSIS — M6281 Muscle weakness (generalized): Secondary | ICD-10-CM | POA: Diagnosis not present

## 2016-06-11 DIAGNOSIS — M25632 Stiffness of left wrist, not elsewhere classified: Secondary | ICD-10-CM | POA: Diagnosis not present

## 2016-06-11 DIAGNOSIS — M25532 Pain in left wrist: Secondary | ICD-10-CM | POA: Diagnosis not present

## 2016-06-11 DIAGNOSIS — M6281 Muscle weakness (generalized): Secondary | ICD-10-CM | POA: Diagnosis not present

## 2016-06-11 DIAGNOSIS — S52502D Unspecified fracture of the lower end of left radius, subsequent encounter for closed fracture with routine healing: Secondary | ICD-10-CM | POA: Diagnosis not present

## 2016-06-13 DIAGNOSIS — M6281 Muscle weakness (generalized): Secondary | ICD-10-CM | POA: Diagnosis not present

## 2016-06-13 DIAGNOSIS — S52502D Unspecified fracture of the lower end of left radius, subsequent encounter for closed fracture with routine healing: Secondary | ICD-10-CM | POA: Diagnosis not present

## 2016-06-13 DIAGNOSIS — M25632 Stiffness of left wrist, not elsewhere classified: Secondary | ICD-10-CM | POA: Diagnosis not present

## 2016-06-13 DIAGNOSIS — M25532 Pain in left wrist: Secondary | ICD-10-CM | POA: Diagnosis not present

## 2016-06-15 DIAGNOSIS — M25632 Stiffness of left wrist, not elsewhere classified: Secondary | ICD-10-CM | POA: Diagnosis not present

## 2016-06-15 DIAGNOSIS — M25532 Pain in left wrist: Secondary | ICD-10-CM | POA: Diagnosis not present

## 2016-06-15 DIAGNOSIS — M6281 Muscle weakness (generalized): Secondary | ICD-10-CM | POA: Diagnosis not present

## 2016-06-15 DIAGNOSIS — S52502D Unspecified fracture of the lower end of left radius, subsequent encounter for closed fracture with routine healing: Secondary | ICD-10-CM | POA: Diagnosis not present

## 2016-06-20 DIAGNOSIS — M25632 Stiffness of left wrist, not elsewhere classified: Secondary | ICD-10-CM | POA: Diagnosis not present

## 2016-06-20 DIAGNOSIS — M25532 Pain in left wrist: Secondary | ICD-10-CM | POA: Diagnosis not present

## 2016-06-20 DIAGNOSIS — S52502D Unspecified fracture of the lower end of left radius, subsequent encounter for closed fracture with routine healing: Secondary | ICD-10-CM | POA: Diagnosis not present

## 2016-06-20 DIAGNOSIS — M6281 Muscle weakness (generalized): Secondary | ICD-10-CM | POA: Diagnosis not present

## 2016-06-22 DIAGNOSIS — M25632 Stiffness of left wrist, not elsewhere classified: Secondary | ICD-10-CM | POA: Diagnosis not present

## 2016-06-22 DIAGNOSIS — M6281 Muscle weakness (generalized): Secondary | ICD-10-CM | POA: Diagnosis not present

## 2016-06-22 DIAGNOSIS — S52502D Unspecified fracture of the lower end of left radius, subsequent encounter for closed fracture with routine healing: Secondary | ICD-10-CM | POA: Diagnosis not present

## 2016-06-22 DIAGNOSIS — M25532 Pain in left wrist: Secondary | ICD-10-CM | POA: Diagnosis not present

## 2016-06-26 DIAGNOSIS — M25532 Pain in left wrist: Secondary | ICD-10-CM | POA: Diagnosis not present

## 2016-06-26 DIAGNOSIS — S52502D Unspecified fracture of the lower end of left radius, subsequent encounter for closed fracture with routine healing: Secondary | ICD-10-CM | POA: Diagnosis not present

## 2016-06-26 DIAGNOSIS — M6281 Muscle weakness (generalized): Secondary | ICD-10-CM | POA: Diagnosis not present

## 2016-06-26 DIAGNOSIS — M25632 Stiffness of left wrist, not elsewhere classified: Secondary | ICD-10-CM | POA: Diagnosis not present

## 2016-06-28 DIAGNOSIS — M6281 Muscle weakness (generalized): Secondary | ICD-10-CM | POA: Diagnosis not present

## 2016-06-28 DIAGNOSIS — S52502D Unspecified fracture of the lower end of left radius, subsequent encounter for closed fracture with routine healing: Secondary | ICD-10-CM | POA: Diagnosis not present

## 2016-06-28 DIAGNOSIS — M25632 Stiffness of left wrist, not elsewhere classified: Secondary | ICD-10-CM | POA: Diagnosis not present

## 2016-06-28 DIAGNOSIS — M25532 Pain in left wrist: Secondary | ICD-10-CM | POA: Diagnosis not present

## 2016-07-02 DIAGNOSIS — M25562 Pain in left knee: Secondary | ICD-10-CM | POA: Diagnosis not present

## 2016-07-02 DIAGNOSIS — M25632 Stiffness of left wrist, not elsewhere classified: Secondary | ICD-10-CM | POA: Diagnosis not present

## 2016-07-02 DIAGNOSIS — M6281 Muscle weakness (generalized): Secondary | ICD-10-CM | POA: Diagnosis not present

## 2016-07-02 DIAGNOSIS — S52502D Unspecified fracture of the lower end of left radius, subsequent encounter for closed fracture with routine healing: Secondary | ICD-10-CM | POA: Diagnosis not present

## 2016-07-06 ENCOUNTER — Ambulatory Visit
Admission: RE | Admit: 2016-07-06 | Discharge: 2016-07-06 | Disposition: A | Discharge status: Home / Self Care | Payer: Medicare HMO | Source: Ambulatory Visit | Attending: Internal Medicine | Admitting: Internal Medicine

## 2016-07-06 DIAGNOSIS — S52502D Unspecified fracture of the lower end of left radius, subsequent encounter for closed fracture with routine healing: Secondary | ICD-10-CM | POA: Diagnosis not present

## 2016-07-06 DIAGNOSIS — M6281 Muscle weakness (generalized): Secondary | ICD-10-CM | POA: Diagnosis not present

## 2016-07-06 DIAGNOSIS — Z1231 Encounter for screening mammogram for malignant neoplasm of breast: Secondary | ICD-10-CM

## 2016-07-06 DIAGNOSIS — M25632 Stiffness of left wrist, not elsewhere classified: Secondary | ICD-10-CM | POA: Diagnosis not present

## 2016-07-06 DIAGNOSIS — M25532 Pain in left wrist: Secondary | ICD-10-CM | POA: Diagnosis not present

## 2016-07-10 DIAGNOSIS — M6281 Muscle weakness (generalized): Secondary | ICD-10-CM | POA: Diagnosis not present

## 2016-07-10 DIAGNOSIS — S52502D Unspecified fracture of the lower end of left radius, subsequent encounter for closed fracture with routine healing: Secondary | ICD-10-CM | POA: Diagnosis not present

## 2016-07-10 DIAGNOSIS — M25632 Stiffness of left wrist, not elsewhere classified: Secondary | ICD-10-CM | POA: Diagnosis not present

## 2016-07-10 DIAGNOSIS — M25532 Pain in left wrist: Secondary | ICD-10-CM | POA: Diagnosis not present

## 2016-07-17 DIAGNOSIS — M6281 Muscle weakness (generalized): Secondary | ICD-10-CM | POA: Diagnosis not present

## 2016-07-17 DIAGNOSIS — M25532 Pain in left wrist: Secondary | ICD-10-CM | POA: Diagnosis not present

## 2016-07-17 DIAGNOSIS — S52502D Unspecified fracture of the lower end of left radius, subsequent encounter for closed fracture with routine healing: Secondary | ICD-10-CM | POA: Diagnosis not present

## 2016-07-17 DIAGNOSIS — M25632 Stiffness of left wrist, not elsewhere classified: Secondary | ICD-10-CM | POA: Diagnosis not present

## 2016-07-25 DIAGNOSIS — S52502D Unspecified fracture of the lower end of left radius, subsequent encounter for closed fracture with routine healing: Secondary | ICD-10-CM | POA: Diagnosis not present

## 2016-09-19 DIAGNOSIS — M25532 Pain in left wrist: Secondary | ICD-10-CM | POA: Diagnosis not present

## 2016-09-19 DIAGNOSIS — S52502D Unspecified fracture of the lower end of left radius, subsequent encounter for closed fracture with routine healing: Secondary | ICD-10-CM | POA: Diagnosis not present

## 2016-12-17 ENCOUNTER — Encounter: Payer: Self-pay | Admitting: Gastroenterology

## 2017-05-27 ENCOUNTER — Other Ambulatory Visit: Payer: Self-pay | Admitting: Internal Medicine

## 2017-05-27 DIAGNOSIS — Z1231 Encounter for screening mammogram for malignant neoplasm of breast: Secondary | ICD-10-CM

## 2017-06-21 DIAGNOSIS — R7301 Impaired fasting glucose: Secondary | ICD-10-CM | POA: Diagnosis not present

## 2017-06-21 DIAGNOSIS — M859 Disorder of bone density and structure, unspecified: Secondary | ICD-10-CM | POA: Diagnosis not present

## 2017-06-21 DIAGNOSIS — E7849 Other hyperlipidemia: Secondary | ICD-10-CM | POA: Diagnosis not present

## 2017-06-21 DIAGNOSIS — R8290 Unspecified abnormal findings in urine: Secondary | ICD-10-CM | POA: Diagnosis not present

## 2017-06-27 DIAGNOSIS — M859 Disorder of bone density and structure, unspecified: Secondary | ICD-10-CM | POA: Diagnosis not present

## 2017-06-27 DIAGNOSIS — Z6825 Body mass index (BMI) 25.0-25.9, adult: Secondary | ICD-10-CM | POA: Diagnosis not present

## 2017-06-27 DIAGNOSIS — S52502S Unspecified fracture of the lower end of left radius, sequela: Secondary | ICD-10-CM | POA: Diagnosis not present

## 2017-06-27 DIAGNOSIS — Z1389 Encounter for screening for other disorder: Secondary | ICD-10-CM | POA: Diagnosis not present

## 2017-06-27 DIAGNOSIS — Z0000 Encounter for general adult medical examination without abnormal findings: Secondary | ICD-10-CM | POA: Diagnosis not present

## 2017-06-27 DIAGNOSIS — Z8672 Personal history of thrombophlebitis: Secondary | ICD-10-CM | POA: Diagnosis not present

## 2017-06-27 DIAGNOSIS — R7301 Impaired fasting glucose: Secondary | ICD-10-CM | POA: Diagnosis not present

## 2017-06-27 DIAGNOSIS — Z23 Encounter for immunization: Secondary | ICD-10-CM | POA: Diagnosis not present

## 2017-07-01 ENCOUNTER — Encounter: Payer: Self-pay | Admitting: Gastroenterology

## 2017-07-04 DIAGNOSIS — Z1212 Encounter for screening for malignant neoplasm of rectum: Secondary | ICD-10-CM | POA: Diagnosis not present

## 2017-07-08 ENCOUNTER — Ambulatory Visit
Admission: RE | Admit: 2017-07-08 | Discharge: 2017-07-08 | Disposition: A | Discharge status: Home / Self Care | Payer: Medicare HMO | Source: Ambulatory Visit | Attending: Internal Medicine | Admitting: Internal Medicine

## 2017-07-08 DIAGNOSIS — Z1231 Encounter for screening mammogram for malignant neoplasm of breast: Secondary | ICD-10-CM | POA: Diagnosis not present

## 2017-08-01 ENCOUNTER — Other Ambulatory Visit: Payer: Self-pay

## 2017-08-01 ENCOUNTER — Ambulatory Visit (AMBULATORY_SURGERY_CENTER): Payer: Self-pay | Admitting: *Deleted

## 2017-08-01 ENCOUNTER — Encounter: Payer: Self-pay | Admitting: Gastroenterology

## 2017-08-01 VITALS — Ht 66.0 in | Wt 158.0 lb

## 2017-08-01 DIAGNOSIS — Z1211 Encounter for screening for malignant neoplasm of colon: Secondary | ICD-10-CM

## 2017-08-01 MED ORDER — NA SULFATE-K SULFATE-MG SULF 17.5-3.13-1.6 GM/177ML PO SOLN: 1.0000 | Freq: Once | ORAL | 0 refills | Status: AC

## 2017-08-01 NOTE — Progress Notes (Signed)
No egg or soy allergy known to patient  No issues with past sedation with any surgeries  or procedures, no intubation problems  No diet pills per patient No home 02 use per patient  No blood thinners per patient  Pt denies issues with constipation  No A fib or A flutter  EMMI video sent to pt's e mail  

## 2017-08-15 ENCOUNTER — Other Ambulatory Visit: Payer: Self-pay

## 2017-08-15 ENCOUNTER — Encounter: Payer: Self-pay | Admitting: Gastroenterology

## 2017-08-15 ENCOUNTER — Ambulatory Visit (AMBULATORY_SURGERY_CENTER): Payer: Medicare HMO | Admitting: Gastroenterology

## 2017-08-15 VITALS — BP 118/78 | HR 84 | Temp 97.5°F | Resp 14 | Ht 66.0 in | Wt 158.0 lb

## 2017-08-15 DIAGNOSIS — Z1211 Encounter for screening for malignant neoplasm of colon: Secondary | ICD-10-CM

## 2017-08-15 DIAGNOSIS — D129 Benign neoplasm of anus and anal canal: Secondary | ICD-10-CM

## 2017-08-15 DIAGNOSIS — D123 Benign neoplasm of transverse colon: Secondary | ICD-10-CM | POA: Diagnosis not present

## 2017-08-15 DIAGNOSIS — D128 Benign neoplasm of rectum: Secondary | ICD-10-CM

## 2017-08-15 MED ORDER — SODIUM CHLORIDE 0.9 % IV SOLN: 500.0000 mL | Freq: Once | INTRAVENOUS | Status: AC

## 2017-08-15 NOTE — Progress Notes (Signed)
Oncall Note  Patient call yesterday evening that she spilled the first half of suprep while opening. Advised her to do instead Miralax with gatorade for 1st half and finish the 2nd bottle of suprep this AM.   Damaris Hippo , MD [phone] Mon-Fri 8a-5p [phone] after 5p, weekends, holidays

## 2017-08-15 NOTE — Progress Notes (Signed)
Report given to PACU, vss 

## 2017-08-15 NOTE — Op Note (Signed)
[facility] Patient Name: Stephanie Saunders Procedure Date: 08/15/2017 10:49 AM MRN: [id number] Endoscopist: Mauri Pole , MD Age: 72 Referring MD:  Date of Birth: 01/19/1946 Gender: Female Account #: [id number] Procedure:                Colonoscopy Indications:              Screening for colorectal malignant neoplasm, Last                            colonoscopy: 2008 Medicines:                Monitored Anesthesia Care Procedure:                Pre-Anesthesia Assessment:                           - Prior to the procedure, a History and Physical                            was performed, and patient medications and                            allergies were reviewed. The patient's tolerance of                            previous anesthesia was also reviewed. The risks                            and benefits of the procedure and the sedation                            options and risks were discussed with the patient.                            All questions were answered, and informed consent                            was obtained. Prior Anticoagulants: The patient has                            taken no previous anticoagulant or antiplatelet                            agents. ASA Grade Assessment: II - A patient with                            mild systemic disease. After reviewing the risks                            and benefits, the patient was deemed in                            satisfactory condition to undergo the procedure.  After obtaining informed consent, the colonoscope                            was passed under direct vision. Throughout the                            procedure, the patient's blood pressure, pulse, and                            oxygen saturations were monitored continuously. The                            Model PCF-H190DL [id number]) scope was introduced                            through the anus and advanced to the  the cecum,                            identified by appendiceal orifice and ileocecal                            valve. The colonoscopy was performed without                            difficulty. The patient tolerated the procedure                            well. The quality of the bowel preparation was                            excellent. The ileocecal valve, appendiceal                            orifice, and rectum were photographed. Scope In: 10:51:06 AM Scope Out: 11:12:22 AM Scope Withdrawal Time: 0 hours 15 minutes 51 seconds  Total Procedure Duration: 0 hours 21 minutes 16 seconds  Findings:                 The perianal and digital rectal examinations were                            normal.                           A 5 mm polyp was found in the transverse colon. The                            polyp was sessile. The polyp was removed with a                            cold snare. Resection and retrieval were complete.                           Two sessile polyps were found in the rectum. The  polyps were 1 to 2 mm in size. These polyps were                            removed with a cold biopsy forceps. Resection and                            retrieval were complete.                           A few small-mouthed diverticula were found in the                            sigmoid colon and descending colon.                           Non-bleeding internal hemorrhoids were found during                            retroflexion. The hemorrhoids were medium-sized.                           The exam was otherwise without abnormality. Complications:            No immediate complications. Estimated Blood Loss:     Estimated blood loss was minimal. Impression:               - One 5 mm polyp in the transverse colon, removed                            with a cold snare. Resected and retrieved.                           - Two 1 to 2 mm polyps in the rectum, removed  with                            a cold biopsy forceps. Resected and retrieved.                           - Diverticulosis in the sigmoid colon and in the                            descending colon.                           - Non-bleeding internal hemorrhoids.                           - The examination was otherwise normal. Recommendation:           - Patient has a contact number available for                            emergencies. The signs and symptoms of potential  delayed complications were discussed with the                            patient. Return to normal activities tomorrow.                            Written discharge instructions were provided to the                            patient.                           - Resume previous diet.                           - Continue present medications.                           - Await pathology results.                           - Repeat colonoscopy in 5-10 years for surveillance                            based on pathology results. Mauri Pole, MD 08/15/2017 11:18:05 AM This report has been signed electronically.

## 2017-08-15 NOTE — Progress Notes (Signed)
I have reviewed the patient's medical history in detail and updated the computerized patient record.

## 2017-08-15 NOTE — Progress Notes (Signed)
Called to room to assist during endoscopic procedure.  Patient ID and intended procedure confirmed with present staff. Received instructions for my participation in the procedure from the performing physician.  

## 2017-08-15 NOTE — Patient Instructions (Signed)
**   Handouts given on polyps, diverticulosis, and hemorrhoids **   YOU HAD AN ENDOSCOPIC PROCEDURE TODAY AT THE [facility]:   Refer to the procedure report that was given to you for any specific questions about what was found during the examination.  If the procedure report does not answer your questions, please call your gastroenterologist to clarify.  If you requested that your care partner not be given the details of your procedure findings, then the procedure report has been included in a sealed envelope for you to review at your convenience later.  YOU SHOULD EXPECT: Some feelings of bloating in the abdomen. Passage of more gas than usual.  Walking can help get rid of the air that was put into your GI tract during the procedure and reduce the bloating. If you had a lower endoscopy (such as a colonoscopy or flexible sigmoidoscopy) you may notice spotting of blood in your stool or on the toilet paper. If you underwent a bowel prep for your procedure, you may not have a normal bowel movement for a few days.  Please Note:  You might notice some irritation and congestion in your nose or some drainage.  This is from the oxygen used during your procedure.  There is no need for concern and it should clear up in a day or so.  SYMPTOMS TO REPORT IMMEDIATELY:   Following lower endoscopy (colonoscopy or flexible sigmoidoscopy):  Excessive amounts of blood in the stool  Significant tenderness or worsening of abdominal pains  Swelling of the abdomen that is new, acute  Fever of 100F or higher  For urgent or emergent issues, a gastroenterologist can be reached at any hour by calling [phone].   DIET:  We do recommend a small meal at first, but then you may proceed to your regular diet.  Drink plenty of fluids but you should avoid alcoholic beverages for 24 hours.  ACTIVITY:  You should plan to take it easy for the rest of today and you should NOT DRIVE or use heavy machinery until  tomorrow (because of the sedation medicines used during the test).    FOLLOW UP: Our staff will call the number listed on your records the next business day following your procedure to check on you and address any questions or concerns that you may have regarding the information given to you following your procedure. If we do not reach you, we will leave a message.  However, if you are feeling well and you are not experiencing any problems, there is no need to return our call.  We will assume that you have returned to your regular daily activities without incident.  If any biopsies were taken you will be contacted by phone or by letter within the next 1-3 weeks.  Please call us at [phone] if you have not heard about the biopsies in 3 weeks.    SIGNATURES/CONFIDENTIALITY: You and/or your care partner have signed paperwork which will be entered into your electronic medical record.  These signatures attest to the fact that that the information above on your After Visit Summary has been reviewed and is understood.  Full responsibility of the confidentiality of this discharge information lies with you and/or your care-partner. 

## 2017-08-16 ENCOUNTER — Telehealth: Payer: Self-pay | Admitting: *Deleted

## 2017-08-16 NOTE — Telephone Encounter (Signed)
  Follow up Call-  Call back number 08/15/2017  Post procedure Call Back phone  # [phone]  Permission to leave phone message Yes  Some recent data might be hidden     Patient questions:  Do you have a fever, pain , or abdominal swelling? No. Pain Score  0 *  Have you tolerated food without any problems? Yes.    Have you been able to return to your normal activities? Yes.    Do you have any questions about your discharge instructions: Diet   No. Medications  No. Follow up visit  No.  Do you have questions or concerns about your Care? No.  Actions: * If pain score is 4 or above: No action needed, pain <4.

## 2017-08-20 ENCOUNTER — Encounter: Payer: Self-pay | Admitting: Gastroenterology

## 2018-03-29 DIAGNOSIS — Z23 Encounter for immunization: Secondary | ICD-10-CM | POA: Diagnosis not present

## 2018-06-03 ENCOUNTER — Other Ambulatory Visit: Payer: Self-pay | Admitting: Internal Medicine

## 2018-06-03 DIAGNOSIS — Z1231 Encounter for screening mammogram for malignant neoplasm of breast: Secondary | ICD-10-CM

## 2018-06-24 DIAGNOSIS — R7301 Impaired fasting glucose: Secondary | ICD-10-CM | POA: Diagnosis not present

## 2018-06-24 DIAGNOSIS — R82998 Other abnormal findings in urine: Secondary | ICD-10-CM | POA: Diagnosis not present

## 2018-06-26 DIAGNOSIS — R82998 Other abnormal findings in urine: Secondary | ICD-10-CM | POA: Diagnosis not present

## 2018-06-26 DIAGNOSIS — M8580 Other specified disorders of bone density and structure, unspecified site: Secondary | ICD-10-CM | POA: Diagnosis not present

## 2018-06-26 DIAGNOSIS — E7800 Pure hypercholesterolemia, unspecified: Secondary | ICD-10-CM | POA: Diagnosis not present

## 2018-06-26 DIAGNOSIS — E785 Hyperlipidemia, unspecified: Secondary | ICD-10-CM | POA: Diagnosis not present

## 2018-06-26 DIAGNOSIS — Z0000 Encounter for general adult medical examination without abnormal findings: Secondary | ICD-10-CM | POA: Diagnosis not present

## 2018-07-01 DIAGNOSIS — R7301 Impaired fasting glucose: Secondary | ICD-10-CM | POA: Diagnosis not present

## 2018-07-01 DIAGNOSIS — Z6825 Body mass index (BMI) 25.0-25.9, adult: Secondary | ICD-10-CM | POA: Diagnosis not present

## 2018-07-01 DIAGNOSIS — M859 Disorder of bone density and structure, unspecified: Secondary | ICD-10-CM | POA: Diagnosis not present

## 2018-07-01 DIAGNOSIS — Z8672 Personal history of thrombophlebitis: Secondary | ICD-10-CM | POA: Diagnosis not present

## 2018-07-01 DIAGNOSIS — E7800 Pure hypercholesterolemia, unspecified: Secondary | ICD-10-CM | POA: Diagnosis not present

## 2018-07-01 DIAGNOSIS — Z1389 Encounter for screening for other disorder: Secondary | ICD-10-CM | POA: Diagnosis not present

## 2018-07-01 DIAGNOSIS — Z0000 Encounter for general adult medical examination without abnormal findings: Secondary | ICD-10-CM | POA: Diagnosis not present

## 2018-07-01 DIAGNOSIS — M722 Plantar fascial fibromatosis: Secondary | ICD-10-CM | POA: Diagnosis not present

## 2018-07-01 DIAGNOSIS — S52502S Unspecified fracture of the lower end of left radius, sequela: Secondary | ICD-10-CM | POA: Diagnosis not present

## 2018-07-03 DIAGNOSIS — Z1212 Encounter for screening for malignant neoplasm of rectum: Secondary | ICD-10-CM | POA: Diagnosis not present

## 2018-07-10 ENCOUNTER — Ambulatory Visit
Admission: RE | Admit: 2018-07-10 | Discharge: 2018-07-10 | Disposition: A | Discharge status: Home / Self Care | Payer: Medicare HMO | Source: Ambulatory Visit | Attending: Internal Medicine | Admitting: Internal Medicine

## 2018-07-10 DIAGNOSIS — Z1231 Encounter for screening mammogram for malignant neoplasm of breast: Secondary | ICD-10-CM

## 2019-03-06 DIAGNOSIS — Z23 Encounter for immunization: Secondary | ICD-10-CM | POA: Diagnosis not present

## 2019-06-01 ENCOUNTER — Other Ambulatory Visit: Payer: Self-pay | Admitting: Internal Medicine

## 2019-06-01 DIAGNOSIS — Z1231 Encounter for screening mammogram for malignant neoplasm of breast: Secondary | ICD-10-CM

## 2019-06-29 DIAGNOSIS — M859 Disorder of bone density and structure, unspecified: Secondary | ICD-10-CM | POA: Diagnosis not present

## 2019-06-29 DIAGNOSIS — R7301 Impaired fasting glucose: Secondary | ICD-10-CM | POA: Diagnosis not present

## 2019-06-29 DIAGNOSIS — E7800 Pure hypercholesterolemia, unspecified: Secondary | ICD-10-CM | POA: Diagnosis not present

## 2019-07-02 DIAGNOSIS — R82998 Other abnormal findings in urine: Secondary | ICD-10-CM | POA: Diagnosis not present

## 2019-07-02 DIAGNOSIS — Z1212 Encounter for screening for malignant neoplasm of rectum: Secondary | ICD-10-CM | POA: Diagnosis not present

## 2019-07-06 DIAGNOSIS — E7800 Pure hypercholesterolemia, unspecified: Secondary | ICD-10-CM | POA: Diagnosis not present

## 2019-07-06 DIAGNOSIS — Z8672 Personal history of thrombophlebitis: Secondary | ICD-10-CM | POA: Diagnosis not present

## 2019-07-06 DIAGNOSIS — D126 Benign neoplasm of colon, unspecified: Secondary | ICD-10-CM | POA: Diagnosis not present

## 2019-07-06 DIAGNOSIS — R7301 Impaired fasting glucose: Secondary | ICD-10-CM | POA: Diagnosis not present

## 2019-07-06 DIAGNOSIS — Z0000 Encounter for general adult medical examination without abnormal findings: Secondary | ICD-10-CM | POA: Diagnosis not present

## 2019-07-06 DIAGNOSIS — M8580 Other specified disorders of bone density and structure, unspecified site: Secondary | ICD-10-CM | POA: Diagnosis not present

## 2019-07-22 ENCOUNTER — Ambulatory Visit: Payer: Medicare HMO

## 2019-07-22 ENCOUNTER — Ambulatory Visit
Admission: RE | Admit: 2019-07-22 | Discharge: 2019-07-22 | Disposition: A | Discharge status: Home / Self Care | Payer: Medicare HMO | Source: Ambulatory Visit | Attending: Internal Medicine | Admitting: Internal Medicine

## 2019-07-22 ENCOUNTER — Other Ambulatory Visit: Payer: Self-pay

## 2019-07-22 DIAGNOSIS — Z1231 Encounter for screening mammogram for malignant neoplasm of breast: Secondary | ICD-10-CM | POA: Diagnosis not present

## 2019-07-24 ENCOUNTER — Ambulatory Visit: Payer: Self-pay

## 2019-08-01 ENCOUNTER — Ambulatory Visit: Payer: Medicare HMO | Attending: Internal Medicine

## 2019-08-01 DIAGNOSIS — Z23 Encounter for immunization: Secondary | ICD-10-CM | POA: Insufficient documentation

## 2019-08-01 NOTE — Progress Notes (Signed)
   Covid-19 Vaccination Clinic  Name:  Stephanie Saunders    MRN: [id number] DOB: [birth year]  08/01/2019  [name] was observed post Covid-19 immunization for 15 minutes without incidence. She was provided with Vaccine Information Sheet and instruction to access the V-Safe system.   [name] was instructed to call 911 with any severe reactions post vaccine: Marland Kitchen Difficulty breathing  . Swelling of your face and throat  . A fast heartbeat  . A bad rash all over your body  . Dizziness and weakness    Immunizations Administered    Name Date Dose VIS Date Route   Pfizer COVID-19 Vaccine 08/01/2019  1:54 PM 0.3 mL 06/05/2019 Intramuscular   Manufacturer: [place]   Lot: [id number]   [place]: [id number]

## 2019-08-04 ENCOUNTER — Ambulatory Visit: Payer: Self-pay

## 2019-08-26 ENCOUNTER — Ambulatory Visit: Payer: Medicare HMO | Attending: Internal Medicine

## 2019-08-26 DIAGNOSIS — Z23 Encounter for immunization: Secondary | ICD-10-CM | POA: Insufficient documentation

## 2019-08-26 NOTE — Progress Notes (Signed)
   Covid-19 Vaccination Clinic  Name:  Marabelle Maltos    MRN: [id number] DOB: [birth year]  08/26/2019  Ms. Withee was observed post Covid-19 immunization for 15 minutes without incident. She was provided with Vaccine Information Sheet and instruction to access the V-Safe system.   Ms. Biedron was instructed to call 911 with any severe reactions post vaccine: Marland Kitchen Difficulty breathing  . Swelling of face and throat  . A fast heartbeat  . A bad rash all over body  . Dizziness and weakness   Immunizations Administered    Name Date Dose VIS Date Route   Pfizer COVID-19 Vaccine 08/26/2019  9:37 AM 0.3 mL 06/05/2019 Intramuscular   Manufacturer: [place]   Lot: [id number]   [place]: [id number]

## 2019-08-27 DIAGNOSIS — M8580 Other specified disorders of bone density and structure, unspecified site: Secondary | ICD-10-CM | POA: Diagnosis not present

## 2020-05-02 DIAGNOSIS — Z23 Encounter for immunization: Secondary | ICD-10-CM | POA: Diagnosis not present

## 2020-06-21 ENCOUNTER — Other Ambulatory Visit: Payer: Self-pay | Admitting: Internal Medicine

## 2020-06-21 DIAGNOSIS — Z1231 Encounter for screening mammogram for malignant neoplasm of breast: Secondary | ICD-10-CM

## 2020-07-05 DIAGNOSIS — E7800 Pure hypercholesterolemia, unspecified: Secondary | ICD-10-CM | POA: Diagnosis not present

## 2020-07-05 DIAGNOSIS — M810 Age-related osteoporosis without current pathological fracture: Secondary | ICD-10-CM | POA: Diagnosis not present

## 2020-07-05 DIAGNOSIS — R7301 Impaired fasting glucose: Secondary | ICD-10-CM | POA: Diagnosis not present

## 2020-07-12 DIAGNOSIS — M722 Plantar fascial fibromatosis: Secondary | ICD-10-CM | POA: Diagnosis not present

## 2020-07-12 DIAGNOSIS — D126 Benign neoplasm of colon, unspecified: Secondary | ICD-10-CM | POA: Diagnosis not present

## 2020-07-12 DIAGNOSIS — R7301 Impaired fasting glucose: Secondary | ICD-10-CM | POA: Diagnosis not present

## 2020-07-12 DIAGNOSIS — E7800 Pure hypercholesterolemia, unspecified: Secondary | ICD-10-CM | POA: Diagnosis not present

## 2020-07-12 DIAGNOSIS — R82998 Other abnormal findings in urine: Secondary | ICD-10-CM | POA: Diagnosis not present

## 2020-07-12 DIAGNOSIS — Z8672 Personal history of thrombophlebitis: Secondary | ICD-10-CM | POA: Diagnosis not present

## 2020-07-12 DIAGNOSIS — M810 Age-related osteoporosis without current pathological fracture: Secondary | ICD-10-CM | POA: Diagnosis not present

## 2020-07-12 DIAGNOSIS — Z0000 Encounter for general adult medical examination without abnormal findings: Secondary | ICD-10-CM | POA: Diagnosis not present

## 2020-07-12 DIAGNOSIS — Z1212 Encounter for screening for malignant neoplasm of rectum: Secondary | ICD-10-CM | POA: Diagnosis not present

## 2020-07-12 DIAGNOSIS — H9313 Tinnitus, bilateral: Secondary | ICD-10-CM | POA: Diagnosis not present

## 2020-07-12 DIAGNOSIS — M25511 Pain in right shoulder: Secondary | ICD-10-CM | POA: Diagnosis not present

## 2020-07-29 ENCOUNTER — Ambulatory Visit
Admission: RE | Admit: 2020-07-29 | Discharge: 2020-07-29 | Disposition: A | Discharge status: Home / Self Care | Payer: Medicare HMO | Source: Ambulatory Visit | Attending: Internal Medicine | Admitting: Internal Medicine

## 2020-07-29 ENCOUNTER — Other Ambulatory Visit: Payer: Self-pay

## 2020-07-29 DIAGNOSIS — Z1231 Encounter for screening mammogram for malignant neoplasm of breast: Secondary | ICD-10-CM | POA: Diagnosis not present

## 2020-08-31 DIAGNOSIS — M25511 Pain in right shoulder: Secondary | ICD-10-CM | POA: Diagnosis not present

## 2020-09-14 DIAGNOSIS — M542 Cervicalgia: Secondary | ICD-10-CM | POA: Diagnosis not present

## 2020-09-14 DIAGNOSIS — M7541 Impingement syndrome of right shoulder: Secondary | ICD-10-CM | POA: Diagnosis not present

## 2020-10-12 DIAGNOSIS — M542 Cervicalgia: Secondary | ICD-10-CM | POA: Diagnosis not present

## 2021-04-15 DIAGNOSIS — Z23 Encounter for immunization: Secondary | ICD-10-CM | POA: Diagnosis not present

## 2021-07-04 ENCOUNTER — Other Ambulatory Visit: Payer: Self-pay | Admitting: Internal Medicine

## 2021-07-04 DIAGNOSIS — Z1231 Encounter for screening mammogram for malignant neoplasm of breast: Secondary | ICD-10-CM

## 2021-07-18 DIAGNOSIS — R7301 Impaired fasting glucose: Secondary | ICD-10-CM | POA: Diagnosis not present

## 2021-07-18 DIAGNOSIS — M810 Age-related osteoporosis without current pathological fracture: Secondary | ICD-10-CM | POA: Diagnosis not present

## 2021-07-18 DIAGNOSIS — E7800 Pure hypercholesterolemia, unspecified: Secondary | ICD-10-CM | POA: Diagnosis not present

## 2021-07-25 DIAGNOSIS — R82998 Other abnormal findings in urine: Secondary | ICD-10-CM | POA: Diagnosis not present

## 2021-07-25 DIAGNOSIS — Z1212 Encounter for screening for malignant neoplasm of rectum: Secondary | ICD-10-CM | POA: Diagnosis not present

## 2021-08-02 ENCOUNTER — Other Ambulatory Visit: Payer: Self-pay

## 2021-08-02 ENCOUNTER — Ambulatory Visit
Admission: RE | Admit: 2021-08-02 | Discharge: 2021-08-02 | Disposition: A | Discharge status: Home / Self Care | Payer: Medicare Other | Source: Ambulatory Visit | Attending: Internal Medicine | Admitting: Internal Medicine

## 2021-08-02 DIAGNOSIS — Z1231 Encounter for screening mammogram for malignant neoplasm of breast: Secondary | ICD-10-CM

## 2021-09-04 DIAGNOSIS — M810 Age-related osteoporosis without current pathological fracture: Secondary | ICD-10-CM | POA: Diagnosis not present

## 2022-03-30 IMAGING — MG MM DIGITAL SCREENING BILAT W/ TOMO AND CAD
8 series · 8 of 24 positions shown · non-contrast
Comparison: Previous exam(s).

CLINICAL DATA: Screening.

EXAM:
DIGITAL SCREENING BILATERAL MAMMOGRAM WITH TOMOSYNTHESIS AND CAD
TECHNIQUE: Bilateral screening digital craniocaudal and mediolateral oblique
mammograms were obtained. Bilateral screening digital breast
tomosynthesis was performed. The images were evaluated with
computer-aided detection.

[R CC synth-2D]
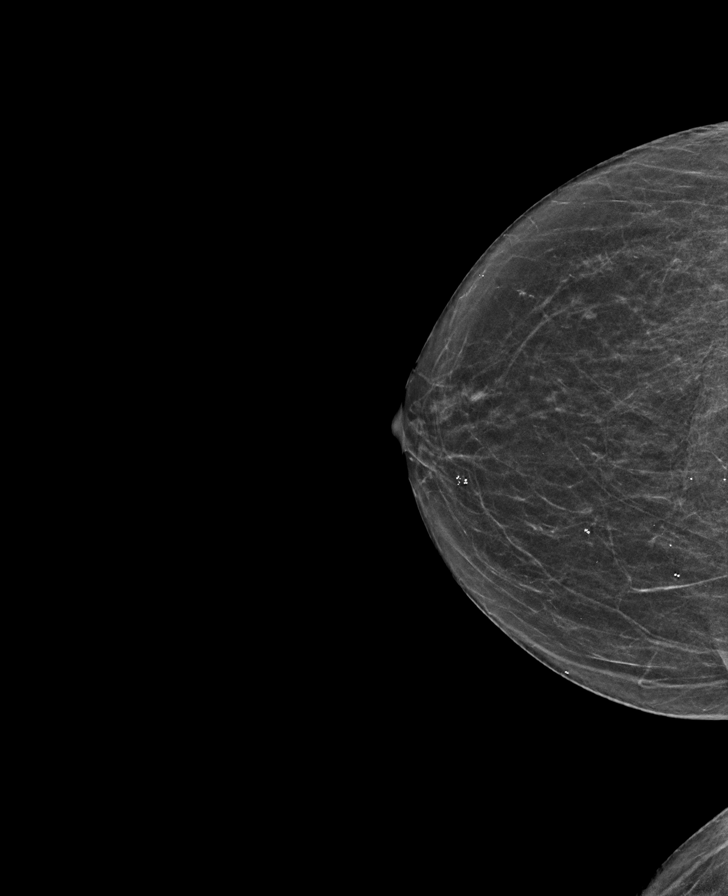

[R MLO synth-2D]
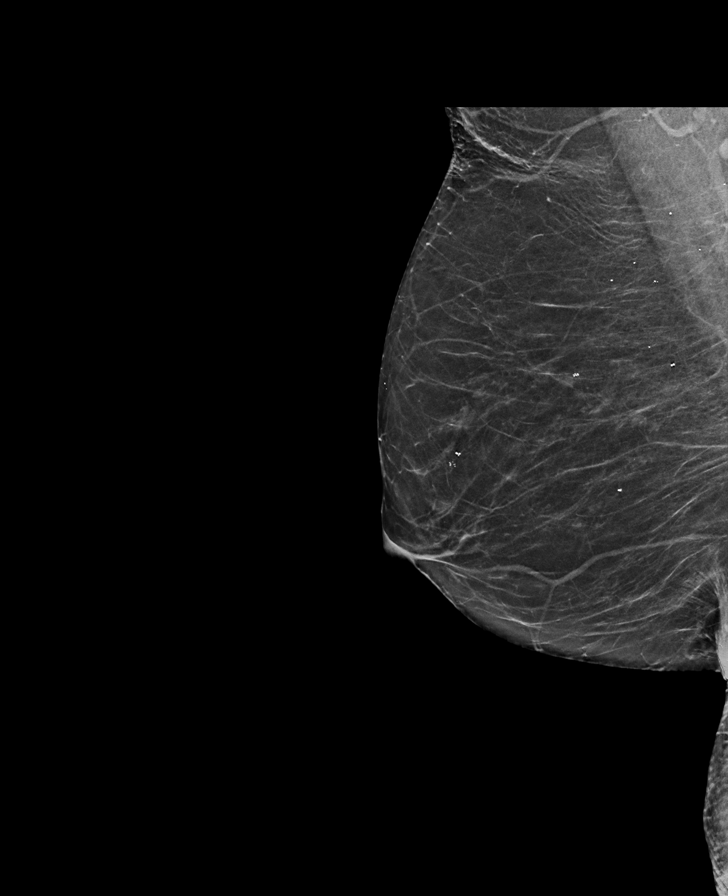

[L CC synth-2D]
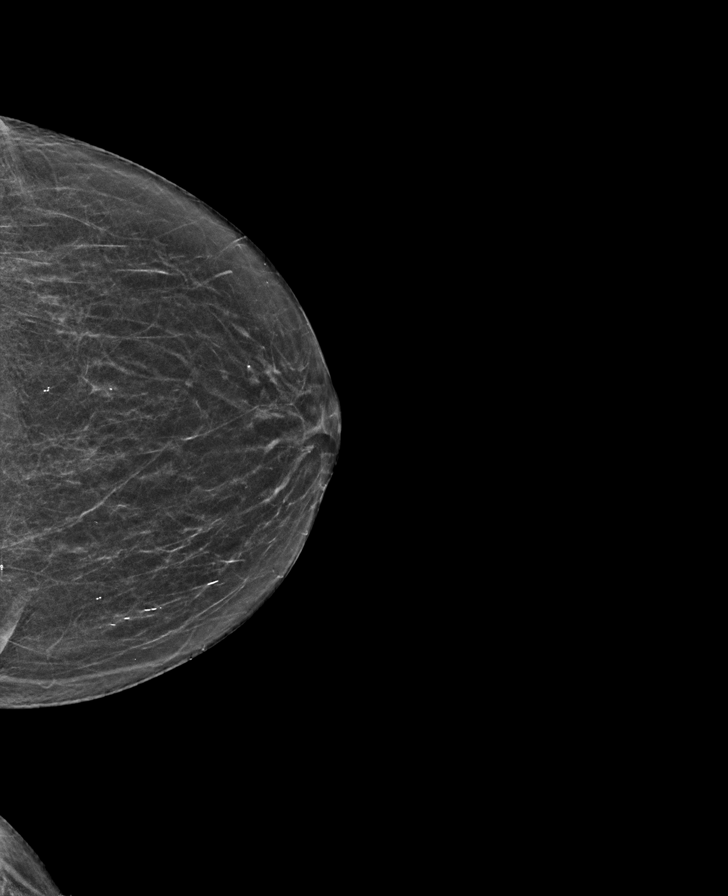

[L MLO synth-2D]
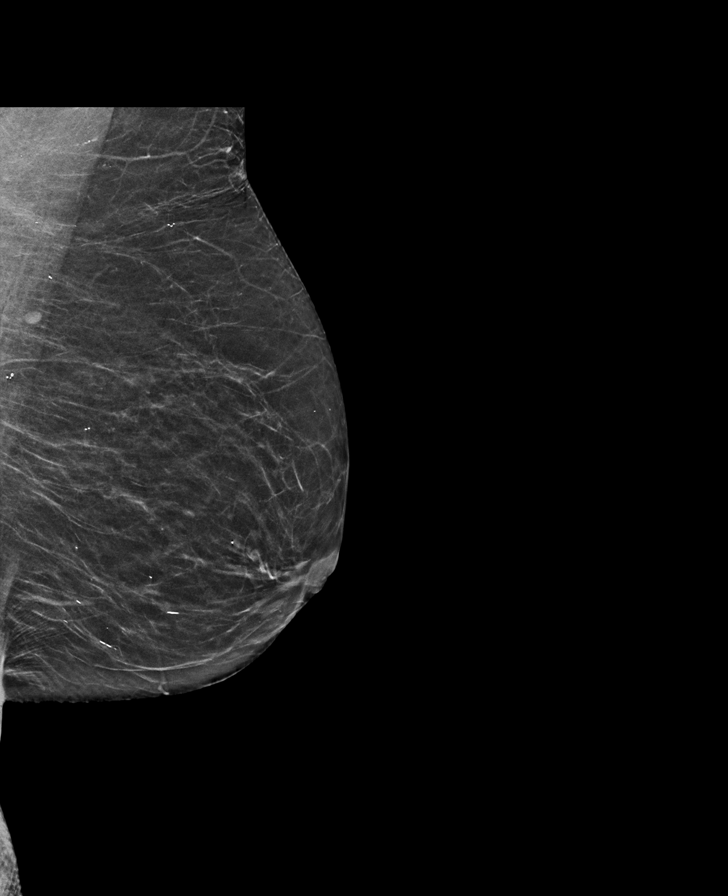

[L MLO tomo · tomo slice 27/53.0]
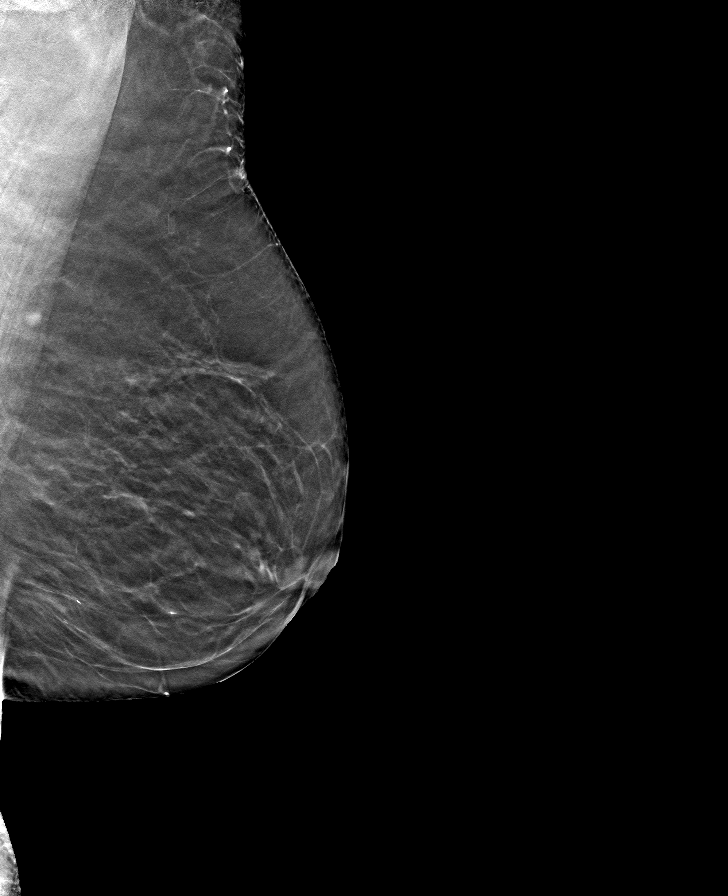

[R MLO tomo · tomo slice 28/55.0]
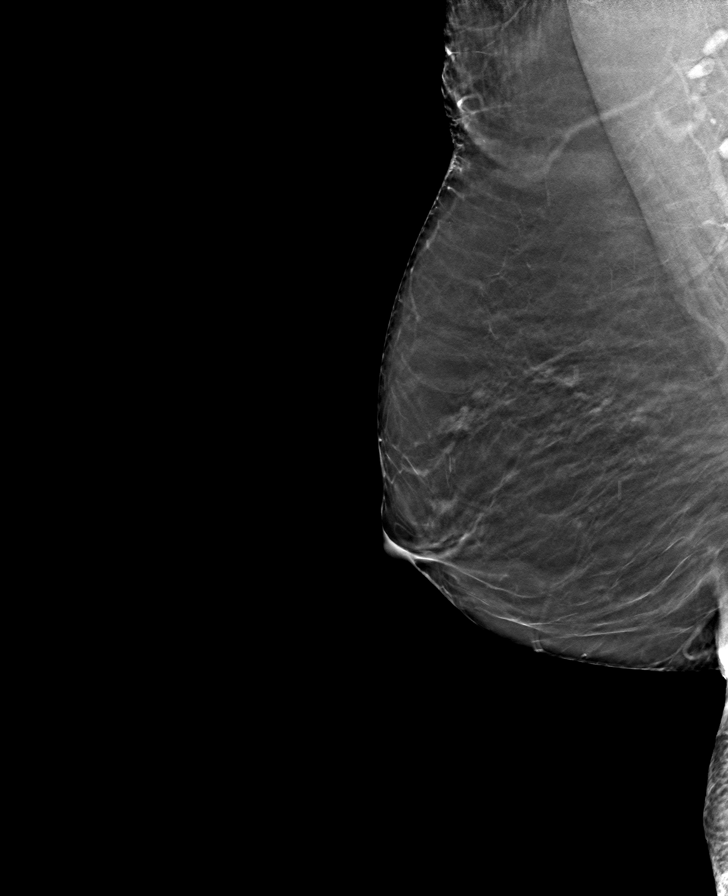

[R CC tomo · tomo slice 25/48.0]
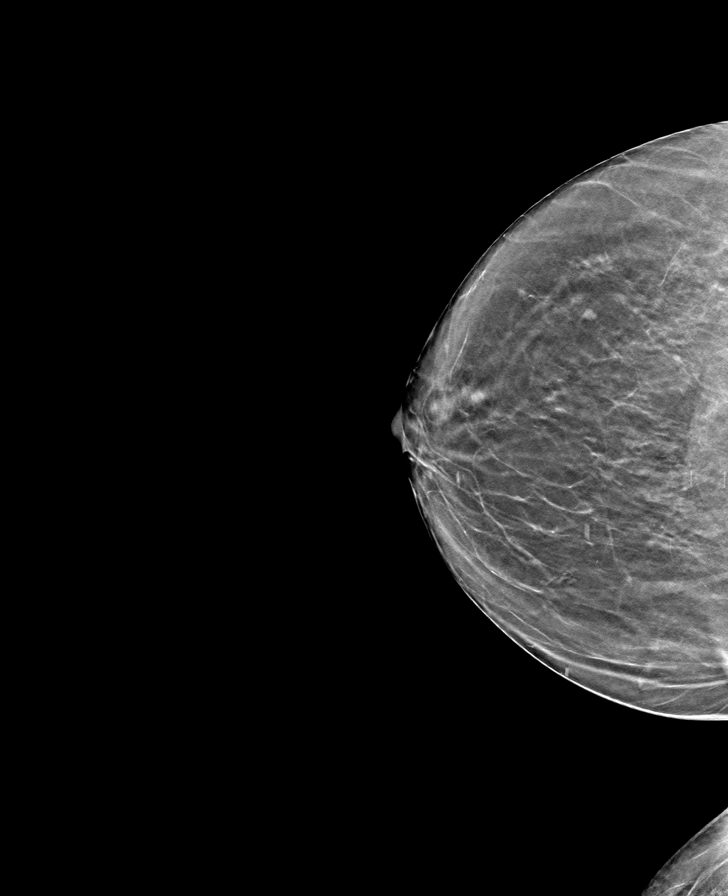

[L CC tomo · tomo slice 25/50.0]
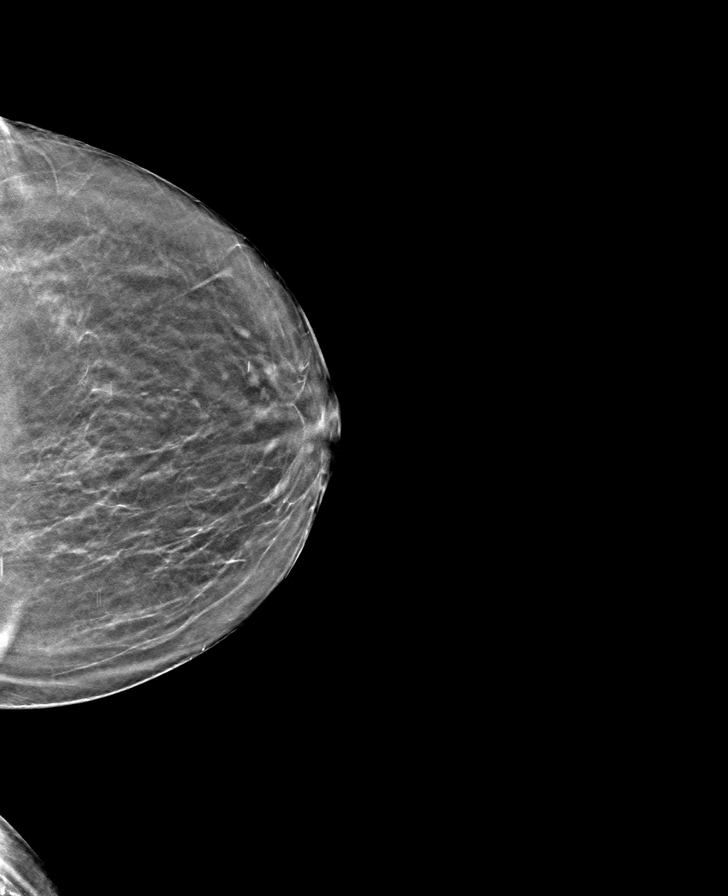

[8 of 24 positions shown; findings below may reference images not displayed]

ACR Breast Density Category b: There are scattered areas of
fibroglandular density.
FINDINGS: There are no findings suspicious for malignancy.
IMPRESSION: No mammographic evidence of malignancy. A result letter of this
screening mammogram will be mailed directly to the patient.

RECOMMENDATION:
Screening mammogram in one year. (Code:51-O-LD2)

BI-RADS CATEGORY  1: Negative.

## 2022-04-07 DIAGNOSIS — Z23 Encounter for immunization: Secondary | ICD-10-CM | POA: Diagnosis not present

## 2022-05-25 DIAGNOSIS — C4492 Squamous cell carcinoma of skin, unspecified: Secondary | ICD-10-CM | POA: Diagnosis not present

## 2022-06-21 DIAGNOSIS — D044 Carcinoma in situ of skin of scalp and neck: Secondary | ICD-10-CM | POA: Diagnosis not present

## 2022-07-03 ENCOUNTER — Other Ambulatory Visit: Payer: Self-pay | Admitting: Internal Medicine

## 2022-07-03 DIAGNOSIS — Z1231 Encounter for screening mammogram for malignant neoplasm of breast: Secondary | ICD-10-CM

## 2022-07-17 DIAGNOSIS — K080 Exfoliation of teeth due to systemic causes: Secondary | ICD-10-CM | POA: Diagnosis not present

## 2022-07-23 DIAGNOSIS — R7301 Impaired fasting glucose: Secondary | ICD-10-CM | POA: Diagnosis not present

## 2022-07-23 DIAGNOSIS — E7800 Pure hypercholesterolemia, unspecified: Secondary | ICD-10-CM | POA: Diagnosis not present

## 2022-07-23 DIAGNOSIS — M810 Age-related osteoporosis without current pathological fracture: Secondary | ICD-10-CM | POA: Diagnosis not present

## 2022-07-30 DIAGNOSIS — Z1331 Encounter for screening for depression: Secondary | ICD-10-CM | POA: Diagnosis not present

## 2022-07-30 DIAGNOSIS — R7301 Impaired fasting glucose: Secondary | ICD-10-CM | POA: Diagnosis not present

## 2022-07-30 DIAGNOSIS — M722 Plantar fascial fibromatosis: Secondary | ICD-10-CM | POA: Diagnosis not present

## 2022-07-30 DIAGNOSIS — E7800 Pure hypercholesterolemia, unspecified: Secondary | ICD-10-CM | POA: Diagnosis not present

## 2022-07-30 DIAGNOSIS — Z1212 Encounter for screening for malignant neoplasm of rectum: Secondary | ICD-10-CM | POA: Diagnosis not present

## 2022-07-30 DIAGNOSIS — R3129 Other microscopic hematuria: Secondary | ICD-10-CM | POA: Diagnosis not present

## 2022-07-30 DIAGNOSIS — Z0000 Encounter for general adult medical examination without abnormal findings: Secondary | ICD-10-CM | POA: Diagnosis not present

## 2022-07-30 DIAGNOSIS — R82998 Other abnormal findings in urine: Secondary | ICD-10-CM | POA: Diagnosis not present

## 2022-07-30 DIAGNOSIS — Z1339 Encounter for screening examination for other mental health and behavioral disorders: Secondary | ICD-10-CM | POA: Diagnosis not present

## 2022-08-23 ENCOUNTER — Ambulatory Visit
Admission: RE | Admit: 2022-08-23 | Discharge: 2022-08-23 | Disposition: A | Discharge status: Home / Self Care | Payer: Medicare Other | Source: Ambulatory Visit | Attending: Internal Medicine | Admitting: Internal Medicine

## 2022-08-23 DIAGNOSIS — Z1231 Encounter for screening mammogram for malignant neoplasm of breast: Secondary | ICD-10-CM

## 2022-10-09 ENCOUNTER — Encounter: Payer: Self-pay | Admitting: Gastroenterology

## 2022-11-12 DIAGNOSIS — K080 Exfoliation of teeth due to systemic causes: Secondary | ICD-10-CM | POA: Diagnosis not present

## 2022-12-21 DIAGNOSIS — M542 Cervicalgia: Secondary | ICD-10-CM | POA: Diagnosis not present

## 2022-12-21 DIAGNOSIS — M7541 Impingement syndrome of right shoulder: Secondary | ICD-10-CM | POA: Diagnosis not present

## 2023-01-28 DIAGNOSIS — R197 Diarrhea, unspecified: Secondary | ICD-10-CM | POA: Diagnosis not present

## 2023-01-28 DIAGNOSIS — K5790 Diverticulosis of intestine, part unspecified, without perforation or abscess without bleeding: Secondary | ICD-10-CM | POA: Diagnosis not present

## 2023-02-08 DIAGNOSIS — R197 Diarrhea, unspecified: Secondary | ICD-10-CM | POA: Diagnosis not present

## 2023-02-08 DIAGNOSIS — K5790 Diverticulosis of intestine, part unspecified, without perforation or abscess without bleeding: Secondary | ICD-10-CM | POA: Diagnosis not present

## 2023-02-11 DIAGNOSIS — R197 Diarrhea, unspecified: Secondary | ICD-10-CM | POA: Diagnosis not present

## 2023-02-12 ENCOUNTER — Encounter: Payer: Self-pay | Admitting: Gastroenterology

## 2023-02-12 ENCOUNTER — Other Ambulatory Visit: Payer: Self-pay | Admitting: Registered Nurse

## 2023-02-12 DIAGNOSIS — R197 Diarrhea, unspecified: Secondary | ICD-10-CM

## 2023-02-12 DIAGNOSIS — R630 Anorexia: Secondary | ICD-10-CM

## 2023-02-14 ENCOUNTER — Ambulatory Visit
Admission: RE | Admit: 2023-02-14 | Discharge: 2023-02-14 | Disposition: A | Discharge status: Home / Self Care | Payer: Medicare Other | Source: Ambulatory Visit | Attending: Registered Nurse | Admitting: Registered Nurse

## 2023-02-14 DIAGNOSIS — R197 Diarrhea, unspecified: Secondary | ICD-10-CM

## 2023-02-14 DIAGNOSIS — R630 Anorexia: Secondary | ICD-10-CM | POA: Diagnosis not present

## 2023-02-14 DIAGNOSIS — K5790 Diverticulosis of intestine, part unspecified, without perforation or abscess without bleeding: Secondary | ICD-10-CM | POA: Diagnosis not present

## 2023-02-14 DIAGNOSIS — K8020 Calculus of gallbladder without cholecystitis without obstruction: Secondary | ICD-10-CM | POA: Diagnosis not present

## 2023-02-14 MED ORDER — IOPAMIDOL (ISOVUE-300) INJECTION 61%
100.0000 mL | Freq: Once | INTRAVENOUS | Status: AC | PRN
  Administered 2023-02-14: 100 mL via INTRAVENOUS

## 2023-04-06 DIAGNOSIS — Z23 Encounter for immunization: Secondary | ICD-10-CM | POA: Diagnosis not present

## 2023-04-26 ENCOUNTER — Ambulatory Visit: Payer: Medicare Other | Admitting: Gastroenterology

## 2023-04-26 ENCOUNTER — Encounter: Payer: Self-pay | Admitting: Gastroenterology

## 2023-04-26 VITALS — BP 112/72 | HR 79 | Ht 67.0 in | Wt 147.4 lb

## 2023-04-26 DIAGNOSIS — R1030 Lower abdominal pain, unspecified: Secondary | ICD-10-CM | POA: Diagnosis not present

## 2023-04-26 DIAGNOSIS — K5900 Constipation, unspecified: Secondary | ICD-10-CM | POA: Diagnosis not present

## 2023-04-26 DIAGNOSIS — Z860100 Personal history of colon polyps, unspecified: Secondary | ICD-10-CM | POA: Diagnosis not present

## 2023-04-26 MED ORDER — NA SULFATE-K SULFATE-MG SULF 17.5-3.13-1.6 GM/177ML PO SOLN: 1.0000 | Freq: Once | ORAL | 0 refills | Status: AC

## 2023-04-26 NOTE — Progress Notes (Signed)
Chief Complaint: change in bowel habits Primary GI MD: Dr. 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  At that time 8/5 she began having diarrhea and weakness having up to 6 episodes of diarrhea per day immediately happening after meals.  BMs were described as loose but not watery.  Labs 01/29/23 with CBC with slight elevation in WBC 11.4 (baseline 10.3-10.8)  Labs 02/08/2023 including CBC and CMP were normal.    Discussed the use of AI scribe software for clinical note transcription with the patient, who gave verbal consent to proceed.  She reports a bout of diarrhea lasting four to five days in late July, which resolved with a BRAT diet.   Since then, her bowel movements have been abnormal, initially presenting as small, hard stools, and more recently, worsening constipation. She reports straining. She denies nausea, vomiting, and rectal bleeding. She has unintentionally lost five to six pounds since July, which she attributes to a decreased appetite.      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  PREVIOUS GI WORKUP   Colonoscopy 07/2017 - One 5 mm polyp in the transverse colon, removed with a cold snare. Resected and retrieved.  - Two 1 to 2 mm polyps in the rectum, removed with a cold biopsy forceps. Resected and retrieved.  - Diverticulosis- Diverticulosis in the sigmoid colon and in the descending colon.  - Non- bleeding internal hemorrhoids.  - The examination was otherwise normal. 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     aspirin EC 81 MG tablet Take 81 mg by mouth daily.     atorvastatin (LIPITOR) 10 MG tablet TK 1 T PO D  3   cholecalciferol (VITAMIN D) 1000 units tablet Take 2,000 Units by mouth daily.     mometasone (ELOCON) 0.1 % lotion      Current Facility-Administered Medications  Medication Dose Route Frequency Provider Last Rate Last Admin   0.9 %  sodium chloride infusion  500 mL Intravenous Once Napoleon Form, MD        Allergies as of 04/26/2023   (No Known Allergies)    Family History  Problem Relation Age of Onset   Colon cancer Neg Hx    Colon polyps Neg Hx    Rectal cancer Neg Hx    Stomach cancer Neg Hx     Social History   Socioeconomic History   Marital status: Single    Spouse name: Not on file   Number of children: 2   Years of education: Not on file   Highest education level: Not on file  Occupational History   Not on file  Tobacco Use   Smoking status: Never   Smokeless tobacco: Never  Vaping Use   Vaping status: Never Used  Substance and Sexual Activity   Alcohol use: Yes    Comment: rare    Drug use: Not Currently    Types: Marijuana   Sexual activity: Not on file  Other Topics Concern   Not on file  Social History Narrative   Not on file   Social Determinants of Health   Financial Resource Strain: Not on file  Food Insecurity: Not on file  Transportation Needs: Not on file  Physical Activity: Not on file  Stress: Not on file  Social Connections: Not on file  Intimate Partner Violence: Not on file    Review of Systems:    Constitutional: No weight loss, fever, chills, weakness or fatigue HEENT: Eyes: No change in vision               Ears, Nose, Throat:  No  change in hearing or congestion Skin: No rash or itching Cardiovascular: No chest pain, chest pressure or palpitations   Respiratory: No SOB or cough Gastrointestinal: See HPI and otherwise negative Genitourinary: No dysuria or change in urinary frequency Neurological: No headache, dizziness or syncope Musculoskeletal: No new muscle or joint pain Hematologic: No bleeding or bruising Psychiatric: No history of depression or anxiety    Physical Exam:  Vital signs: BP 112/72   Pulse 79   Ht 5\' 7"  (1.702 m)   Wt 78.1 kg   BMI 26.98 kg/m   Constitutional: NAD, Well developed, Well nourished, alert and cooperative Head:  Normocephalic and atraumatic. Eyes:   PEERL, EOMI. No icterus. Conjunctiva pink. Respiratory: Respirations even and unlabored. Lungs clear to auscultation bilaterally.   No wheezes, crackles, or rhonchi.  Cardiovascular:  Regular rate and rhythm. No peripheral edema, cyanosis or pallor.  Gastrointestinal:  Soft, nondistended, nontender. No rebound or guarding. Normal bowel sounds. No appreciable masses or hepatomegaly. Rectal:  Not performed.  Msk:  Symmetrical without gross deformities. Without edema, no deformity or joint abnormality.  Neurologic:  Alert and  oriented x4;  grossly normal neurologically.  Skin:   Dry and intact without significant lesions or rashes. Psychiatric: Oriented to person, place and time. Demonstrates good judgement and reason without abnormal affect or behaviors.    RELEVANT LABS AND IMAGING: CBC    Component Value Date/Time   WBC 7.5 04/12/2009 0937   WBC 5.6 03/11/2009 0850   RBC 4.12 04/12/2009 0937   RBC 3.27 (L) 03/11/2009 0850   HGB 13.1 04/12/2009 0937   HCT 38.7 04/12/2009 0937   PLT 252 04/12/2009 0937   MCV 93.9 04/12/2009 0937   MCH 31.9 04/12/2009 0937   MCHC 34.0 04/12/2009 0937   MCHC 33.7 03/11/2009 0850   RDW 14.0 04/12/2009 0937   LYMPHSABS 1.8 04/12/2009 0937   MONOABS 0.5 04/12/2009 0937   EOSABS 0.2  04/12/2009 0937   BASOSABS 0.0 04/12/2009 0937    CMP     Component Value Date/Time   NA 141 04/12/2009 0937   K 4.1 04/12/2009 0937   CL 110 04/12/2009 0937   CO2 22 04/12/2009 0937   GLUCOSE 96 04/12/2009 0937   BUN 16 04/12/2009 0937   CREATININE 0.75 04/12/2009 0937   CALCIUM 9.1 04/12/2009 0937   PROT 6.7 04/12/2009 0937   ALBUMIN 4.2 04/12/2009 0937   AST 18 04/12/2009 0937   ALT 25 04/12/2009 0937   ALKPHOS  64 04/12/2009 0937   BILITOT 0.5 04/12/2009 0937   GFRNONAA >60 03/08/2009 0600   GFRAA  03/08/2009 0600    >60        The eGFR has been calculated using the MDRD equation. This calculation has not been validated in all clinical situations. eGFR's persistently <60 mL/min signify possible Chronic Kidney Disease.     Assessment/Plan:      Change in Bowel Habits Recent history of diarrhea in July, now with constipation and straining. Bowel movements have changed from small balls to infrequent small amounts. No associated nausea, vomiting, or bleeding. Mild weight loss likely due to decreased appetite. 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 -Start MiraLAX one cap full daily, adjust as needed. -Encouraged to increase water intake to 64 ounces daily and increase physical activity. -Consider use of a squatty potty to aid in bowel movements.  Colon Cancer Screening Due for colonoscopy, history of precancerous polyps in 2019. -Schedule colonoscopy with Dr. Lavon Paganini. -Consider extra prep due to current constipation to ensure adequate bowel cleansing. - I thoroughly discussed the procedure with the patient (at bedside) to include nature of the procedure, alternatives, benefits, and risks (including but not limited to bleeding, infection, perforation, anesthesia/cardiac pulmonary complications).  Patient verbalized understanding and gave verbal consent to proceed with procedure.   Follow-up based on colonoscopy findings. If symptoms do not improve, patient to make an  appointment.      Lara Mulch [facility] 04/26/2023, 2:46 PM  Cc: Gaspar Garbe, MD

## 2023-04-26 NOTE — Patient Instructions (Addendum)
VISIT SUMMARY:  During today's visit, we discussed your recent changes in bowel habits, including a bout of diarrhea in July followed by constipation and straining. We also reviewed your history of multiple polyps removed during a colonoscopy in 2019 and the need for a follow-up colonoscopy.  YOUR PLAN:  -CHANGE IN BOWEL HABITS: You have experienced a change in your bowel habits, starting with diarrhea in July and now constipation and straining. This could be due to post-infectious irritable bowel syndrome (IBS), which can occur after an infection. To help manage this, start taking MiraLAX once daily, increase your water intake to 64 ounces per day, and try to be more physically active. Using a squatty potty may also help with bowel movements.  -COLON CANCER SCREENING: Given your history of precancerous polyps found during your 2019 colonoscopy, it is important to have a follow-up colonoscopy. We will schedule this with Dr. Lavon Paganini. Due to your current constipation, you may need extra preparation to ensure your bowels are adequately cleansed for the procedure.  INSTRUCTIONS:  Please schedule your colonoscopy with Dr. Lavon Paganini. Follow the instructions provided for bowel preparation carefully. If your symptoms do not improve, make an appointment for further evaluation.  Start over the counter Miralax daily.   You can purchase a squatty potty over the counter on [address] and a medical supply store.   You have been scheduled for a colonoscopy. Please follow written instructions given to you at your visit today.   Please pick up your prep supplies at the pharmacy within the next 1-3 days.  If you use inhalers (even only as needed), please bring them with you on the day of your procedure.  DO NOT TAKE 7 DAYS PRIOR TO TEST- Trulicity (dulaglutide) Ozempic, Wegovy (semaglutide) Mounjaro (tirzepatide) Bydureon Bcise (exanatide extended release)  DO NOT TAKE 1 DAY PRIOR TO YOUR TEST Rybelsus  (semaglutide) Adlyxin (lixisenatide) Victoza (liraglutide) Byetta (exanatide) ___________________________________________________________________________  _______________________________________________________  If your blood pressure at your visit was 140/90 or greater, please contact your primary care physician to follow up on this.  _______________________________________________________  If you are age 64 or older, your body mass index should be between 23-30. Your Body mass index is 26.98 kg/m. If this is out of the aforementioned range listed, please consider follow up with your Primary Care Provider.  If you are age 4 or younger, your body mass index should be between 19-25. Your Body mass index is 26.98 kg/m. If this is out of the aformentioned range listed, please consider follow up with your Primary Care Provider.   ________________________________________________________  The [facility] providers would like to encourage you to use [facility] to communicate with providers for non-urgent requests or questions.  Due to long hold times on the telephone, sending your provider a message by [facility] may be a faster and more efficient way to get a response.  Please allow 48 business hours for a response.  Please remember that this is for non-urgent requests.  _______________________________________________________

## 2023-05-31 ENCOUNTER — Ambulatory Visit (AMBULATORY_SURGERY_CENTER): Payer: Medicare Other | Admitting: Gastroenterology

## 2023-05-31 ENCOUNTER — Encounter: Payer: Self-pay | Admitting: Gastroenterology

## 2023-05-31 VITALS — BP 117/78 | HR 79 | Temp 97.8°F | Resp 16 | Ht 67.0 in | Wt 147.0 lb

## 2023-05-31 DIAGNOSIS — D649 Anemia, unspecified: Secondary | ICD-10-CM | POA: Diagnosis not present

## 2023-05-31 DIAGNOSIS — K648 Other hemorrhoids: Secondary | ICD-10-CM

## 2023-05-31 DIAGNOSIS — Z1211 Encounter for screening for malignant neoplasm of colon: Secondary | ICD-10-CM

## 2023-05-31 DIAGNOSIS — K644 Residual hemorrhoidal skin tags: Secondary | ICD-10-CM | POA: Diagnosis not present

## 2023-05-31 DIAGNOSIS — K5730 Diverticulosis of large intestine without perforation or abscess without bleeding: Secondary | ICD-10-CM

## 2023-05-31 DIAGNOSIS — Z860101 Personal history of adenomatous and serrated colon polyps: Secondary | ICD-10-CM

## 2023-05-31 DIAGNOSIS — R194 Change in bowel habit: Secondary | ICD-10-CM

## 2023-05-31 DIAGNOSIS — Z860100 Personal history of colon polyps, unspecified: Secondary | ICD-10-CM

## 2023-05-31 MED ORDER — SODIUM CHLORIDE 0.9 % IV SOLN: 500.0000 mL | Freq: Once | INTRAVENOUS | Status: DC

## 2023-05-31 NOTE — Patient Instructions (Signed)
Thank you for letting us take care of your healthcare needs today. Please see handouts given to you on Diverticulosis and Hemorrhoids.    YOU HAD AN ENDOSCOPIC PROCEDURE TODAY AT THE [facility]:   Refer to the procedure report that was given to you for any specific questions about what was found during the examination.  If the procedure report does not answer your questions, please call your gastroenterologist to clarify.  If you requested that your care partner not be given the details of your procedure findings, then the procedure report has been included in a sealed envelope for you to review at your convenience later.  YOU SHOULD EXPECT: Some feelings of bloating in the abdomen. Passage of more gas than usual.  Walking can help get rid of the air that was put into your GI tract during the procedure and reduce the bloating. If you had a lower endoscopy (such as a colonoscopy or flexible sigmoidoscopy) you may notice spotting of blood in your stool or on the toilet paper. If you underwent a bowel prep for your procedure, you may not have a normal bowel movement for a few days.  Please Note:  You might notice some irritation and congestion in your nose or some drainage.  This is from the oxygen used during your procedure.  There is no need for concern and it should clear up in a day or so.  SYMPTOMS TO REPORT IMMEDIATELY:  Following lower endoscopy (colonoscopy or flexible sigmoidoscopy):  Excessive amounts of blood in the stool  Significant tenderness or worsening of abdominal pains  Swelling of the abdomen that is new, acute  Fever of 100F or higher   For urgent or emergent issues, a gastroenterologist can be reached at any hour by calling [phone]. Do not use MyChart messaging for urgent concerns.    DIET:  We do recommend a small meal at first, but then you may proceed to your regular diet.  Drink plenty of fluids but you should avoid alcoholic beverages for 24  hours.  ACTIVITY:  You should plan to take it easy for the rest of today and you should NOT DRIVE or use heavy machinery until tomorrow (because of the sedation medicines used during the test).    FOLLOW UP: Our staff will call the number listed on your records the next business day following your procedure.  We will call around 7:15- 8:00 am to check on you and address any questions or concerns that you may have regarding the information given to you following your procedure. If we do not reach you, we will leave a message.     If any biopsies were taken you will be contacted by phone or by letter within the next 1-3 weeks.  Please call us at [phone] if you have not heard about the biopsies in 3 weeks.    SIGNATURES/CONFIDENTIALITY: You and/or your care partner have signed paperwork which will be entered into your electronic medical record.  These signatures attest to the fact that that the information above on your After Visit Summary has been reviewed and is understood.  Full responsibility of the confidentiality of this discharge information lies with you and/or your care-partner. 

## 2023-05-31 NOTE — Op Note (Signed)
[facility] Patient Name: Stephanie Saunders Procedure Date: 05/31/2023 2:44 PM MRN: [id number] Endoscopist: Napoleon Form , MD, [id number] Age: 77 Referring MD:  Date of Birth: Jul 31, 1945 Gender: Female Account #: [id number] Procedure:                Colonoscopy Indications:              High risk colon cancer surveillance: Personal                            history of colonic polyps, High risk colon cancer                            surveillance: Personal history of multiple (3 or                            more) adenomas, High risk colon cancer                            surveillance: Personal history of adenoma less than                            10 mm in size, Incidental change in bowel habits                            noted, Incidental constipation noted Medicines:                Monitored Anesthesia Care Procedure:                Pre-Anesthesia Assessment:                           - Prior to the procedure, a History and Physical                            was performed, and patient medications and                            allergies were reviewed. The patient's tolerance of                            previous anesthesia was also reviewed. The risks                            and benefits of the procedure and the sedation                            options and risks were discussed with the patient.                            All questions were answered, and informed consent                            was obtained. Prior Anticoagulants: The patient has  taken no anticoagulant or antiplatelet agents. ASA                            Grade Assessment: III - A patient with severe                            systemic disease. After reviewing the risks and                            benefits, the patient was deemed in satisfactory                            condition to undergo the procedure.                           After obtaining informed  consent, the colonoscope                            was passed under direct vision. Throughout the                            procedure, the patient's blood pressure, pulse, and                            oxygen saturations were monitored continuously. The                            Olympus Scope [id number] was introduced through the                            anus and advanced to the the cecum, identified by                            appendiceal orifice and ileocecal valve. The                            colonoscopy was performed without difficulty. The                            patient tolerated the procedure well. The quality                            of the bowel preparation was good. The ileocecal                            valve, appendiceal orifice, and rectum were                            photographed. Scope In: 2:51:01 PM Scope Out: 3:04:25 PM Scope Withdrawal Time: 0 hours 11 minutes 11 seconds  Total Procedure Duration: 0 hours 13 minutes 24 seconds  Findings:                 The perianal and digital rectal examinations were  normal.                           Scattered medium-mouthed and small-mouthed                            diverticula were found in the sigmoid colon and                            descending colon.                           Non-bleeding external and internal hemorrhoids were                            found during retroflexion. The hemorrhoids were                            medium-sized. Complications:            No immediate complications. Estimated Blood Loss:     Estimated blood loss was minimal. Impression:               - Diverticulosis in the sigmoid colon and in the                            descending colon.                           - Non-bleeding external and internal hemorrhoids.                           - No specimens collected. Recommendation:           - Patient has a contact number available for                             emergencies. The signs and symptoms of potential                            delayed complications were discussed with the                            patient. Return to normal activities tomorrow.                            Written discharge instructions were provided to the                            patient.                           - Resume previous diet.                           - Continue present medications.                           - No  repeat colonoscopy due to age. Napoleon Form, MD 05/31/2023 3:15:48 PM This report has been signed electronically.

## 2023-05-31 NOTE — Progress Notes (Signed)
To pacu, VSS. Report to [email or web address] 

## 2023-05-31 NOTE — Progress Notes (Signed)
[facility] History and Physical   Primary Care Physician:  Tisovec, Adelfa Koh, MD   Reason for Procedure:  H/o colon polyps, Change in bowel habits, anemia  Plan:    colonoscopy with possible interventions as needed     HPI: Stephanie Saunders is a very pleasant 77 y.o. female here for colonoscopy for h/o colon polyps, evaluation of change in bowel habits and chronic anemia.   The risks and benefits as well as alternatives of endoscopic procedure(s) have been discussed and reviewed. All questions answered. The patient agrees to proceed.    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 Authorizing Provider  aspirin EC 81 MG tablet Take 81 mg by mouth daily.   Yes [provider]  atorvastatin (LIPITOR) 10 MG tablet TK 1 T PO D 05/11/17  Yes [provider]  cholecalciferol (VITAMIN D) 1000 units tablet Take 2,000 Units by mouth daily.   Yes [provider]  mometasone (ELOCON) 0.1 % lotion  08/15/13  Yes [provider]  alendronate (FOSAMAX) 70 MG tablet Take 70 mg by mouth once a week.    [provider]    Current Outpatient Medications  Medication Sig Dispense Refill   aspirin EC 81 MG tablet Take 81 mg by mouth daily.     atorvastatin (LIPITOR) 10 MG tablet TK 1 T PO D  3   cholecalciferol (VITAMIN D) 1000 units tablet Take 2,000 Units by mouth daily.     mometasone (ELOCON) 0.1 % lotion      alendronate (FOSAMAX) 70 MG tablet Take 70 mg by mouth once a week.     Current Facility-Administered Medications  Medication Dose Route Frequency  Provider Last Rate Last Admin   0.9 %  sodium chloride infusion  500 mL Intravenous Once Brysen Shankman V, MD       0.9 %  sodium chloride infusion  500 mL Intravenous Once Napoleon Form, MD        Allergies as of 05/31/2023   (No Known Allergies)    Family History  Problem Relation Age of Onset   Colon cancer Neg Hx    Colon polyps Neg Hx    Rectal cancer Neg Hx    Stomach cancer Neg Hx    Esophageal cancer Neg Hx     Social History   Socioeconomic History   Marital status: Single    Spouse name: Not on file   Number of children: 2   Years of education: Not on file   Highest education level: Not on file  Occupational History   Not on file  Tobacco Use   Smoking status: Never   Smokeless tobacco: Never  Vaping Use   Vaping status: Never Used  Substance and Sexual Activity   Alcohol use: Yes    Comment: rare    Drug use: Not Currently    Types: Marijuana   Sexual activity: Not on file  Other Topics  Concern   Not on file  Social History Narrative   Not on file   Social Determinants of Health   Financial Resource Strain: Not on file  Food Insecurity: Not on file  Transportation Needs: Not on file  Physical Activity: Not on file  Stress: Not on file  Social Connections: Not on file  Intimate Partner Violence: Not on file    Review of Systems:  All other review of systems negative except as mentioned in the HPI.  Physical Exam: Vital signs in last 24 hours: BP (!) 132/93   Pulse 91   Temp 97.8 F (36.6 C)   Resp 19   Ht 5\' 7"  (1.702 m)   Wt 147 lb (66.7 kg)   SpO2 97%   BMI 23.02 kg/m  General:   Alert, NAD Lungs:  Clear .   Heart:  Regular rate and rhythm Abdomen:  Soft, nontender and nondistended. Neuro/Psych:  Alert and cooperative. Normal mood and affect. A and O x 3  Reviewed labs, radiology imaging, old records and pertinent past GI work up  Patient is appropriate for planned procedure(s) and anesthesia in an ambulatory  setting   K. Scherry Ran , MD [phone]

## 2023-06-03 ENCOUNTER — Telehealth: Payer: Self-pay | Admitting: *Deleted

## 2023-06-03 NOTE — Telephone Encounter (Signed)
  Follow up Call-     05/31/2023    2:12 PM  Call back number  Post procedure Call Back phone  # [phone]  Permission to leave phone message Yes     Patient questions:  Do you have a fever, pain , or abdominal swelling? No. Pain Score  0 *  Have you tolerated food without any problems? Yes.    Have you been able to return to your normal activities? Yes.    Do you have any questions about your discharge instructions: Diet   No. Medications  No. Follow up visit  No.  Do you have questions or concerns about your Care? No.  Actions: * If pain score is 4 or above: No action needed, pain <4.

## 2023-07-22 ENCOUNTER — Other Ambulatory Visit: Payer: Self-pay | Admitting: Internal Medicine

## 2023-07-22 DIAGNOSIS — K080 Exfoliation of teeth due to systemic causes: Secondary | ICD-10-CM | POA: Diagnosis not present

## 2023-07-22 DIAGNOSIS — Z1231 Encounter for screening mammogram for malignant neoplasm of breast: Secondary | ICD-10-CM

## 2023-07-29 DIAGNOSIS — R7301 Impaired fasting glucose: Secondary | ICD-10-CM | POA: Diagnosis not present

## 2023-07-29 DIAGNOSIS — M810 Age-related osteoporosis without current pathological fracture: Secondary | ICD-10-CM | POA: Diagnosis not present

## 2023-07-29 DIAGNOSIS — E7800 Pure hypercholesterolemia, unspecified: Secondary | ICD-10-CM | POA: Diagnosis not present

## 2023-08-05 DIAGNOSIS — R82998 Other abnormal findings in urine: Secondary | ICD-10-CM | POA: Diagnosis not present

## 2023-08-05 DIAGNOSIS — E7800 Pure hypercholesterolemia, unspecified: Secondary | ICD-10-CM | POA: Diagnosis not present

## 2023-08-05 DIAGNOSIS — Z0000 Encounter for general adult medical examination without abnormal findings: Secondary | ICD-10-CM | POA: Diagnosis not present

## 2023-08-05 DIAGNOSIS — Z1339 Encounter for screening examination for other mental health and behavioral disorders: Secondary | ICD-10-CM | POA: Diagnosis not present

## 2023-08-05 DIAGNOSIS — Z1331 Encounter for screening for depression: Secondary | ICD-10-CM | POA: Diagnosis not present

## 2023-08-27 ENCOUNTER — Ambulatory Visit
Admission: RE | Admit: 2023-08-27 | Discharge: 2023-08-27 | Disposition: A | Discharge status: Home / Self Care | Payer: Medicare Other | Source: Ambulatory Visit | Attending: Internal Medicine | Admitting: Internal Medicine

## 2023-08-27 DIAGNOSIS — Z1231 Encounter for screening mammogram for malignant neoplasm of breast: Secondary | ICD-10-CM

## 2023-11-11 DIAGNOSIS — M810 Age-related osteoporosis without current pathological fracture: Secondary | ICD-10-CM | POA: Diagnosis not present

## 2023-12-23 DIAGNOSIS — K080 Exfoliation of teeth due to systemic causes: Secondary | ICD-10-CM | POA: Diagnosis not present

## 2024-02-08 ENCOUNTER — Emergency Department (HOSPITAL_COMMUNITY)
Admission: EM | Admit: 2024-02-08 | Discharge: 2024-02-08 | Disposition: A | Discharge status: Home / Self Care | Attending: Emergency Medicine | Admitting: Emergency Medicine

## 2024-02-08 ENCOUNTER — Emergency Department (HOSPITAL_COMMUNITY)

## 2024-02-08 ENCOUNTER — Other Ambulatory Visit: Payer: Self-pay

## 2024-02-08 ENCOUNTER — Encounter (HOSPITAL_COMMUNITY): Payer: Self-pay | Admitting: Radiology

## 2024-02-08 DIAGNOSIS — I774 Celiac artery compression syndrome: Secondary | ICD-10-CM

## 2024-02-08 DIAGNOSIS — E785 Hyperlipidemia, unspecified: Secondary | ICD-10-CM | POA: Insufficient documentation

## 2024-02-08 DIAGNOSIS — R630 Anorexia: Secondary | ICD-10-CM | POA: Diagnosis not present

## 2024-02-08 DIAGNOSIS — I700 Atherosclerosis of aorta: Secondary | ICD-10-CM | POA: Diagnosis not present

## 2024-02-08 DIAGNOSIS — R6881 Early satiety: Secondary | ICD-10-CM | POA: Diagnosis not present

## 2024-02-08 DIAGNOSIS — R531 Weakness: Secondary | ICD-10-CM | POA: Diagnosis not present

## 2024-02-08 DIAGNOSIS — K8020 Calculus of gallbladder without cholecystitis without obstruction: Secondary | ICD-10-CM | POA: Insufficient documentation

## 2024-02-08 DIAGNOSIS — K760 Fatty (change of) liver, not elsewhere classified: Secondary | ICD-10-CM | POA: Diagnosis not present

## 2024-02-08 DIAGNOSIS — I708 Atherosclerosis of other arteries: Secondary | ICD-10-CM | POA: Diagnosis not present

## 2024-02-08 DIAGNOSIS — E860 Dehydration: Secondary | ICD-10-CM | POA: Insufficient documentation

## 2024-02-08 DIAGNOSIS — Z7982 Long term (current) use of aspirin: Secondary | ICD-10-CM | POA: Insufficient documentation

## 2024-02-08 DIAGNOSIS — N3001 Acute cystitis with hematuria: Secondary | ICD-10-CM | POA: Diagnosis not present

## 2024-02-08 DIAGNOSIS — I4510 Unspecified right bundle-branch block: Secondary | ICD-10-CM | POA: Insufficient documentation

## 2024-02-08 DIAGNOSIS — R197 Diarrhea, unspecified: Secondary | ICD-10-CM | POA: Diagnosis not present

## 2024-02-08 DIAGNOSIS — Z86718 Personal history of other venous thrombosis and embolism: Secondary | ICD-10-CM | POA: Insufficient documentation

## 2024-02-08 DIAGNOSIS — R5383 Other fatigue: Secondary | ICD-10-CM | POA: Diagnosis not present

## 2024-02-08 DIAGNOSIS — R1110 Vomiting, unspecified: Secondary | ICD-10-CM | POA: Diagnosis not present

## 2024-02-08 DIAGNOSIS — K551 Chronic vascular disorders of intestine: Secondary | ICD-10-CM | POA: Diagnosis not present

## 2024-02-08 DIAGNOSIS — A419 Sepsis, unspecified organism: Secondary | ICD-10-CM | POA: Diagnosis not present

## 2024-02-08 DIAGNOSIS — M1990 Unspecified osteoarthritis, unspecified site: Secondary | ICD-10-CM | POA: Diagnosis not present

## 2024-02-08 DIAGNOSIS — R634 Abnormal weight loss: Secondary | ICD-10-CM | POA: Diagnosis not present

## 2024-02-08 LAB — COMPREHENSIVE METABOLIC PANEL WITH GFR
ALT: 20 U/L (ref 0–44)
AST: 17 U/L (ref 15–41)
Albumin: 4 g/dL (ref 3.5–5.0)
Alkaline Phosphatase: 53 U/L (ref 38–126)
Anion gap: 11 (ref 5–15)
BUN: 34 mg/dL — ABNORMAL HIGH (ref 8–23)
CO2: 17 mmol/L — ABNORMAL LOW (ref 22–32)
Calcium: 9.8 mg/dL (ref 8.9–10.3)
Chloride: 110 mmol/L (ref 98–111)
Creatinine, Ser: 0.98 mg/dL (ref 0.44–1.00)
GFR, Estimated: 59 mL/min — ABNORMAL LOW (ref >60)
Glucose, Bld: 121 mg/dL — ABNORMAL HIGH (ref 70–99)
Potassium: 4 mmol/L (ref 3.5–5.1)
Sodium: 138 mmol/L (ref 135–145)
Total Bilirubin: 0.9 mg/dL (ref 0.0–1.2)
Total Protein: 7.4 g/dL (ref 6.5–8.1)

## 2024-02-08 LAB — CBC WITH DIFFERENTIAL/PLATELET
Abs Immature Granulocytes: 0.05 K/uL (ref 0.00–0.07)
Basophils Absolute: 0 K/uL (ref 0.0–0.1)
Basophils Relative: 0 %
Eosinophils Absolute: 0.1 K/uL (ref 0.0–0.5)
Eosinophils Relative: 0 %
HCT: 47.2 % — ABNORMAL HIGH (ref 36.0–46.0)
Hemoglobin: 15.9 g/dL — ABNORMAL HIGH (ref 12.0–15.0)
Immature Granulocytes: 0 %
Lymphocytes Relative: 14 %
Lymphs Abs: 1.6 K/uL (ref 0.7–4.0)
MCH: 30.5 pg (ref 26.0–34.0)
MCHC: 33.7 g/dL (ref 30.0–36.0)
MCV: 90.6 fL (ref 80.0–100.0)
Monocytes Absolute: 1.2 K/uL — ABNORMAL HIGH (ref 0.1–1.0)
Monocytes Relative: 11 %
Neutro Abs: 8.5 K/uL — ABNORMAL HIGH (ref 1.7–7.7)
Neutrophils Relative %: 75 %
Platelets: 329 K/uL (ref 150–400)
RBC: 5.21 MIL/uL — ABNORMAL HIGH (ref 3.87–5.11)
RDW: 12.8 % (ref 11.5–15.5)
WBC: 11.5 K/uL — ABNORMAL HIGH (ref 4.0–10.5)
nRBC: 0 % (ref 0.0–0.2)

## 2024-02-08 LAB — URINALYSIS, MICROSCOPIC (REFLEX)

## 2024-02-08 LAB — RESP PANEL BY RT-PCR (RSV, FLU A&B, COVID)  RVPGX2
Influenza A by PCR: NEGATIVE
Influenza B by PCR: NEGATIVE
Resp Syncytial Virus by PCR: NEGATIVE
SARS Coronavirus 2 by RT PCR: NEGATIVE

## 2024-02-08 LAB — URINALYSIS, ROUTINE W REFLEX MICROSCOPIC
Bilirubin Urine: NEGATIVE
Glucose, UA: NEGATIVE mg/dL
Ketones, ur: NEGATIVE mg/dL
Nitrite: NEGATIVE
Protein, ur: 30 mg/dL — AB
Specific Gravity, Urine: 1.025 (ref 1.005–1.030)
pH: 5.5 (ref 5.0–8.0)

## 2024-02-08 LAB — LIPASE, BLOOD: Lipase: 35 U/L (ref 11–51)

## 2024-02-08 LAB — TROPONIN I (HIGH SENSITIVITY): Troponin I (High Sensitivity): 5 ng/L (ref <18)

## 2024-02-08 LAB — D-DIMER, QUANTITATIVE: D-Dimer, Quant: 0.82 ug{FEU}/mL — ABNORMAL HIGH (ref 0.00–0.50)

## 2024-02-08 MED ORDER — CEPHALEXIN 250 MG PO CAPS
500.0000 mg | ORAL_CAPSULE | Freq: Once | ORAL | Status: AC
  Administered 2024-02-08: 500 mg via ORAL
  Filled 2024-02-08: qty 2

## 2024-02-08 MED ORDER — IOHEXOL 350 MG/ML SOLN
75.0000 mL | Freq: Once | INTRAVENOUS | Status: AC | PRN
  Administered 2024-02-08: 75 mL via INTRAVENOUS

## 2024-02-08 MED ORDER — SODIUM CHLORIDE 0.9 % IV SOLN: 1.0000 g | Freq: Once | INTRAVENOUS | Status: DC

## 2024-02-08 MED ORDER — ONDANSETRON HCL 4 MG/2ML IJ SOLN
4.0000 mg | Freq: Once | INTRAMUSCULAR | Status: DC
  Filled 2024-02-08: qty 2

## 2024-02-08 MED ORDER — SODIUM CHLORIDE 0.9 % IV BOLUS
1000.0000 mL | Freq: Once | INTRAVENOUS | Status: AC
  Administered 2024-02-08: 1000 mL via INTRAVENOUS

## 2024-02-08 NOTE — ED Provider Triage Note (Signed)
 Emergency Medicine Provider Triage Evaluation Note  Stephanie Saunders , a 78 y.o. female  was evaluated in triage.  Pt complains of weakness and decreased appetite.  Reports has been ongoing for the last 2 weeks.  Had vomiting about 1 week ago that is now resolved.  Had 2 episodes of diarrhea in the last day.  No reported fevers.  Was reportedly seen at urgent care today and diagnosed the UTI but has not started treatment.  No significant abdominal pain.  Review of Systems  Positive: As above Negative: As above  Physical Exam  BP (!) 128/94   Pulse (!) 131   Temp 98 F (36.7 C) (Oral)   Resp 18   Ht 5' 6 (1.676 m)   Wt 60.3 kg   SpO2 98%   BMI 21.47 kg/m  Gen:   Awake, no distress   Resp:  Normal effort  MSK:   Moves extremities without difficulty  Other:  Focal abdominal tenderness and no acute neurological deficit seen  Medical Decision Making  Medically screening exam initiated at 5:41 PM.  Appropriate orders placed.  Alisa KATHEE Hamilton was informed that the remainder of the evaluation will be completed by another provider, this initial triage assessment does not replace that evaluation, and the importance of remaining in the ED until their evaluation is complete.     Liliann File A, PA-C 02/08/24 [phone]

## 2024-02-08 NOTE — ED Triage Notes (Signed)
 No appetite in the last two weeks. This week she has only been able to take in boost and some chicken. She does endorse one episode of vomiting and for the past two days she has had a couple of episodes of diarrhea. She is now having weakness and was concerned.

## 2024-02-08 NOTE — ED Notes (Signed)
 Pt refusing all blood work and IV medication at this time. EDP aware and placing discharge orders

## 2024-02-08 NOTE — ED Provider Notes (Signed)
 [place] EMERGENCY DEPARTMENT AT [facility] Provider Note   CSN: [id number] Arrival date & time: 02/08/24  1650     Patient presents with: Weakness and decreased appetite   Stephanie Saunders is a 78 y.o. female.   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  Pt said she's been feeling very weak over the past few weeks.  She's not had an appetite.  She did have vomiting about a week ago, but that has resolved.  She did go to [facility] today and was told she had a UTI.  Pt was sent here because she had an abn EKG.  No cp.  No sob.       Prior to Admission medications   Medication Sig Start Date End Date Taking? Authorizing Provider  alendronate (FOSAMAX) 70 MG tablet Take 70 mg by mouth once a week.    [provider]  aspirin EC 81 MG tablet Take 81 mg by mouth daily.    [provider]  atorvastatin  (LIPITOR) 10 MG tablet TK 1 T PO D 05/11/17   [provider]  cholecalciferol (VITAMIN D) 1000 units tablet Take 2,000 Units by mouth daily.    [provider]  mometasone (ELOCON) 0.1 % lotion  08/15/13   [provider]    Allergies: Patient has no known allergies.    Review of Systems  Neurological:  Positive for weakness.  All other systems reviewed and are negative.   Updated Vital Signs BP 115/70   Pulse 98   Temp 98 F (36.7 C) (Oral)   Resp (!) 8   Ht 5' 6 (1.676 m)   Wt 60.3 kg   SpO2 100%   BMI 21.47 kg/m   Physical Exam Vitals and nursing note reviewed.  Constitutional:      Appearance: Normal appearance.  HENT:     Head: Normocephalic and atraumatic.     Right Ear: External ear normal.     Left Ear: External ear normal.     Nose: Nose normal.     Mouth/Throat:     Mouth: Mucous membranes are dry.  Eyes:     Extraocular Movements: Extraocular movements intact.     Conjunctiva/sclera: Conjunctivae normal.     Pupils: Pupils are equal, round, and  reactive to light.  Cardiovascular:     Rate and Rhythm: Tachycardia present.     Pulses: Normal pulses.     Heart sounds: Normal heart sounds.  Pulmonary:     Effort: Pulmonary effort is normal.     Breath sounds: Normal breath sounds.  Abdominal:     General: Abdomen is flat. Bowel sounds are normal.     Palpations: Abdomen is soft.  Musculoskeletal:        General: Normal range of motion.     Cervical back: Normal range of motion and neck supple.  Skin:    General: Skin is warm and dry.     Capillary Refill: Capillary refill takes less than 2 seconds.  Neurological:     General: No focal deficit present.     Mental Status: She is alert and oriented to person, place, and time.  Psychiatric:        Mood and Affect: Mood normal.        Behavior: Behavior normal.        Thought Content: Thought content normal.        Judgment: Judgment  normal.     (all labs ordered are listed, but only abnormal results are displayed) Labs Reviewed  CBC WITH DIFFERENTIAL/PLATELET - Abnormal; Notable for the following components:      Result Value   WBC 11.5 (*)    RBC 5.21 (*)    Hemoglobin 15.9 (*)    HCT 47.2 (*)    Neutro Abs 8.5 (*)    Monocytes Absolute 1.2 (*)    All other components within normal limits  COMPREHENSIVE METABOLIC PANEL WITH GFR - Abnormal; Notable for the following components:   CO2 17 (*)    Glucose, Bld 121 (*)    BUN 34 (*)    GFR, Estimated 59 (*)    All other components within normal limits  URINALYSIS, ROUTINE W REFLEX MICROSCOPIC - Abnormal; Notable for the following components:   APPearance CLOUDY (*)    Hgb urine dipstick MODERATE (*)    Protein, ur 30 (*)    Leukocytes,Ua LARGE (*)    All other components within normal limits  D-DIMER, QUANTITATIVE - Abnormal; Notable for the following components:   D-Dimer, Quant 0.82 (*)    All other components within normal limits  URINALYSIS, MICROSCOPIC (REFLEX) - Abnormal; Notable for the following components:    Bacteria, UA MANY (*)    All other components within normal limits  RESP PANEL BY RT-PCR (RSV, FLU A&B, COVID)  RVPGX2  CULTURE, BLOOD (ROUTINE X 2)  CULTURE, BLOOD (ROUTINE X 2)  URINE CULTURE  LIPASE, BLOOD  I-STAT CG4 LACTIC ACID, ED  TROPONIN I (HIGH SENSITIVITY)  TROPONIN I (HIGH SENSITIVITY)    EKG: EKG Interpretation Date/Time:  Saturday February 08 2024 17:28:20 EDT Ventricular Rate:  129 PR Interval:  132 QRS Duration:  118 QT Interval:  382 QTC Calculation: 559 R Axis:   97  Text Interpretation: Sinus tachycardia Right atrial enlargement Pulmonary disease pattern Right bundle branch block T wave abnormality, consider inferior ischemia Abnormal ECG No previous ECGs available Confirmed by Dean Clarity [phone]) on 02/08/2024 6:48:56 PM  Radiology: CT CHEST ABDOMEN PELVIS W CONTRAST Result Date: 02/08/2024 CLINICAL DATA:  Weakness, sepsis, decreased appetite EXAM: CT CHEST, ABDOMEN, AND PELVIS WITH CONTRAST TECHNIQUE: Multidetector CT imaging of the chest, abdomen and pelvis was performed following the standard protocol during bolus administration of intravenous contrast. RADIATION DOSE REDUCTION: This exam was performed according to the departmental dose-optimization program which includes automated exposure control, adjustment of the mA and/or kV according to patient size and/or use of iterative reconstruction technique. CONTRAST:  75mL OMNIPAQUE  IOHEXOL  350 MG/ML SOLN COMPARISON:  CT abdomen pelvis 02/14/2023 and same day chest radiograph FINDINGS: CT CHEST FINDINGS Cardiovascular: Aortic atherosclerotic calcification. No pericardial effusion. Normal caliber thoracic aorta. Mediastinum/Nodes: No enlarged mediastinal, hilar, or axillary lymph nodes. Thyroid gland, trachea, and esophagus demonstrate no significant findings. Lungs/Pleura: No focal consolidation, pleural effusion, or pneumothorax. Pleural thickening and calcified pleural plaques in the posterior right upper lobe.  Musculoskeletal: No acute fracture. CT ABDOMEN PELVIS FINDINGS Hepatobiliary: Hepatic steatosis. Cholelithiasis. No evidence of acute cholecystitis. No biliary dilation. Pancreas: Unremarkable. Spleen: Unremarkable. Adrenals/Urinary Tract: Normal adrenal glands. No urinary calculi or hydronephrosis. Unremarkable bladder. Stomach/Bowel: Normal caliber large and small bowel. Normal appendix. No bowel wall thickening. Stomach is within normal limits. Vascular/Lymphatic: Aortic atherosclerosis with calcified plaques causing stenosis of the mesenteric arteries near their origins. There is severe narrowing of the celiac axis and moderate narrowing of the SMA. Left iliac vein stent. Patency is not well evaluated due to arterial phase exam.  No enlarged abdominal or pelvic lymph nodes. Reproductive: Uterus and bilateral adnexa are unremarkable. Other: No free intraperitoneal fluid or air. Musculoskeletal: No acute fracture. IMPRESSION: 1. No acute abnormality in the chest, abdomen, or pelvis. 2. Aortic atherosclerosis with calcified plaques causing stenosis of the mesenteric arteries near their origins. There is severe narrowing of the celiac axis and moderate narrowing of the SMA. Correlate for signs and symptoms of chronic mesenteric ischemia. 3. Hepatic steatosis. 4. Cholelithiasis. Electronically Signed   By: Norman Gatlin M.D.   On: 02/08/2024 20:59   DG Chest 1 View Result Date: 02/08/2024 CLINICAL DATA:  Weakness EXAM: CHEST  1 VIEW COMPARISON:  February 08, 2024 FINDINGS: The heart size and mediastinal contours are within normal limits. Both lungs are clear. The visualized skeletal structures are unremarkable. IMPRESSION: No active disease. Electronically Signed   By: Michaeline Blanch M.D.   On: 02/08/2024 18:12     Procedures   Medications Ordered in the ED  ondansetron  (ZOFRAN ) injection 4 mg (4 mg Intravenous Not Given 02/08/24 1927)  cephALEXin  (KEFLEX ) capsule 500 mg (has no administration in time range)   sodium chloride  0.9 % bolus 1,000 mL (0 mLs Intravenous Stopped 02/08/24 1927)  iohexol  (OMNIPAQUE ) 350 MG/ML injection 75 mL (75 mLs Intravenous Contrast Given 02/08/24 2017)                                    Medical Decision Making Amount and/or Complexity of Data Reviewed Labs: ordered. Radiology: ordered.  Risk Prescription drug management.   This patient presents to the ED for concern of weakness, this involves an extensive number of treatment options, and is a complaint that carries with it a high risk of complications and morbidity.  The differential diagnosis includes dehydration, electrolyte abn, infection, sepsis, PE   Co morbidities that complicate the patient evaluation  HLD, arthritis, and hx DVT (not on thinners)   Additional history obtained:  Additional history obtained from epic chart review External records from outside source obtained and reviewed including family   Lab Tests:  I Ordered, and personally interpreted labs.  The pertinent results include:  cbc nl, cmp nl other than bun elevated at 34; trop nl; ddimer 0.82; UA + for UTI   Imaging Studies ordered:  I ordered imaging studies including cxr, ct chest/abd/pelvis  I independently visualized and interpreted imaging which showed  CXR: No active disease.  I agree with the radiologist interpretation   Cardiac Monitoring:  The patient was maintained on a cardiac monitor.  I personally viewed and interpreted the cardiac monitored which showed an underlying rhythm of: st   Medicines ordered and prescription drug management:  I ordered medication including ivfs  for sx  Reevaluation of the patient after these medicines showed that the patient improved I have reviewed the patients home medicines and have made adjustments as needed   Test Considered:  ct   Critical Interventions:  ivfs  Problem List / ED Course:  UTI with dehydration:  hr has improved with IVFs.  Pt refused IV  Rocephin.  She said she was ready to go and just wanted oral.  Pt has a rx for oral abx at the pharmacy that were sent from UC. Celiac and SMA stenosis:  pt referred to vascular New RBBB:  no cp.  Trop nl.  PE neg. Pt referred to cards.     Reevaluation:  After the interventions noted  above, I reevaluated the patient and found that they have :improved   Social Determinants of Health:  Lives at home   Dispostion:  After consideration of the diagnostic results and the patients response to treatment, I feel that the patent would benefit from discharge with outpatient f/u.       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

## 2024-02-10 DIAGNOSIS — R0982 Postnasal drip: Secondary | ICD-10-CM | POA: Diagnosis not present

## 2024-02-10 LAB — URINE CULTURE

## 2024-02-13 ENCOUNTER — Ambulatory Visit: Attending: Cardiology | Admitting: Cardiology

## 2024-02-13 ENCOUNTER — Encounter: Payer: Self-pay | Admitting: Cardiology

## 2024-02-13 VITALS — BP 110/78 | HR 107 | Ht 66.0 in | Wt 131.0 lb

## 2024-02-13 DIAGNOSIS — E782 Mixed hyperlipidemia: Secondary | ICD-10-CM | POA: Diagnosis not present

## 2024-02-13 DIAGNOSIS — I4510 Unspecified right bundle-branch block: Secondary | ICD-10-CM | POA: Insufficient documentation

## 2024-02-13 MED ORDER — ATORVASTATIN CALCIUM 20 MG PO TABS: 20.0000 mg | ORAL_TABLET | Freq: Every day | ORAL | 3 refills | Status: AC

## 2024-02-13 NOTE — Patient Instructions (Signed)
 Medication Instructions:  INCREASE Lipitor to 20 mg daily   STOP Aspirin   *If you need a refill on your cardiac medications before your next appointment, please call your pharmacy*  Testing/Procedures: Echo  Your physician has requested that you have an echocardiogram. Echocardiography is a painless test that uses sound waves to create images of your heart. It provides your doctor with information about the size and shape of your heart and how well your heart's chambers and valves are working. This procedure takes approximately one hour. There are no restrictions for this procedure. Please do NOT wear cologne, perfume, aftershave, or lotions (deodorant is allowed). Please arrive 15 minutes prior to your appointment time.  Please note: We ask at that you not bring children with you during ultrasound (echo/ vascular) testing. Due to room size and safety concerns, children are not allowed in the ultrasound rooms during exams. Our front office staff cannot provide observation of children in our lobby area while testing is being conducted. An adult accompanying a patient to their appointment will only be allowed in the ultrasound room at the discretion of the ultrasound technician under special circumstances. We apologize for any inconvenience.   Follow-Up: At [facility], you and your health needs are our priority.  As part of our continuing mission to provide you with exceptional heart care, our providers are all part of one team.  This team includes your primary Cardiologist (physician) and Advanced Practice Providers or APPs (Physician Assistants and Nurse Practitioners) who all work together to provide you with the care you need, when you need it.  Your next appointment:   As needed    Provider:   Newman JINNY Lawrence, MD

## 2024-02-13 NOTE — Progress Notes (Signed)
  Cardiology Office Note:  .   Date:  02/13/2024  ID:  Stephanie Saunders, DOB [birth year], MRN [id number] PCP: Auston Opal, DO  [facility] Providers Cardiologist:  Newman Lawrence, MD PCP: Auston Opal, DO  Chief Complaint  Patient presents with   Right bundle branch block\     Stephanie Saunders is a 78 y.o. female with right bundle branch block  Discussed the use of AI scribe software for clinical note transcription with the patient, who gave verbal consent to proceed.  History of Present Illness Stephanie Saunders is a 78 year old female who presents with an abnormal EKG finding of right bundle branch block.  She has no chest pain, shortness of breath, or leg swelling. There is no family history of heart disease. She takes aspirin 81 mg and Lipitor 10 mg. She does not smoke and consumes minimal alcohol  and caffeine.  Patient is currently being treated for a UTI.    Vitals:   02/13/24 0846  BP: 110/78  Pulse: (!) 107  SpO2: 95%      Review of Systems  Cardiovascular:  Negative for chest pain, dyspnea on exertion, leg swelling, palpitations and syncope.        Studies Reviewed: [place]        EKG 02/13/2024: Sinus tachycardia 107 bpm Right atrial enlargement Right bundle branch block When compared with ECG of 08-Feb-2024 17:28, No significant change was found    Labs 2-01/2024: Chol 161, TG 75, HDL 58, LDL 88 HbA1C 5.5% Hb 15.9 Cr 0.9     Physical Exam Vitals and nursing note reviewed.  Constitutional:      General: She is not in acute distress. Neck:     Vascular: No JVD.  Cardiovascular:     Rate and Rhythm: Regular rhythm. Tachycardia present.     Heart sounds: Normal heart sounds. No murmur heard. Pulmonary:     Effort: Pulmonary effort is normal.     Breath sounds: Normal breath sounds. No wheezing or rales.      VISIT DIAGNOSES:   ICD-10-CM   1. Right bundle branch block  I45.10 EKG 12-Lead    ECHOCARDIOGRAM COMPLETE    2. Mixed  hyperlipidemia  E78.2        Stephanie Saunders is a 78 y.o. female with RBBB Assessment & Plan Right bundle branch block: Incidental EKG finding without symptoms. Can occur in healthy individuals without indicating major heart issues. -Mild tachycardia possibly due to ongoing UTI, anxiety. - Order echocardiogram to assess heart function. - If echocardiogram is normal, no further follow-up needed.  Mixed hyperlipidemia: Cholesterol levels suboptimal on Lipitor 10 mg. Recommended increase to 20 mg to further lower cholesterol. Advised discontinuation of aspirin due to bleeding risk without significant coronary history. - Increase Lipitor to 20 mg daily. - Discontinue aspirin in absence of prior MI, stroke, nonobstructive ASCVD.Stephanie Saunders - Primary doctor to refill Lipitor prescription in the future.    Meds ordered this encounter  Medications   atorvastatin  (LIPITOR) 20 MG tablet    Sig: Take 1 tablet (20 mg total) by mouth daily.    Dispense:  90 tablet    Refill:  3     F/u as needed  Signed, Newman JINNY Lawrence, MD

## 2024-02-25 ENCOUNTER — Other Ambulatory Visit: Payer: Self-pay

## 2024-02-25 DIAGNOSIS — K551 Chronic vascular disorders of intestine: Secondary | ICD-10-CM

## 2024-03-03 DIAGNOSIS — I4510 Unspecified right bundle-branch block: Secondary | ICD-10-CM | POA: Diagnosis not present

## 2024-03-03 DIAGNOSIS — Z23 Encounter for immunization: Secondary | ICD-10-CM | POA: Diagnosis not present

## 2024-03-03 DIAGNOSIS — M810 Age-related osteoporosis without current pathological fracture: Secondary | ICD-10-CM | POA: Diagnosis not present

## 2024-03-03 DIAGNOSIS — K551 Chronic vascular disorders of intestine: Secondary | ICD-10-CM | POA: Diagnosis not present

## 2024-03-03 DIAGNOSIS — E782 Mixed hyperlipidemia: Secondary | ICD-10-CM | POA: Diagnosis not present

## 2024-03-16 ENCOUNTER — Encounter: Payer: Self-pay | Admitting: Vascular Surgery

## 2024-03-16 ENCOUNTER — Encounter: Payer: Self-pay | Admitting: Cardiology

## 2024-03-19 ENCOUNTER — Ambulatory Visit (HOSPITAL_COMMUNITY)
Admission: RE | Admit: 2024-03-19 | Discharge: 2024-03-19 | Disposition: A | Discharge status: Home / Self Care | Source: Ambulatory Visit | Attending: Cardiology | Admitting: Cardiology

## 2024-03-19 DIAGNOSIS — I4510 Unspecified right bundle-branch block: Secondary | ICD-10-CM | POA: Diagnosis not present

## 2024-03-19 LAB — ECHOCARDIOGRAM COMPLETE
Area-P 1/2: 3.34 cm2
P 1/2 time: 574 ms
S' Lateral: 2.3 cm

## 2024-03-20 ENCOUNTER — Ambulatory Visit: Payer: Self-pay | Admitting: Cardiology

## 2024-03-23 NOTE — Progress Notes (Unsigned)
 Office Note     CC: Incidental finding mesenteric artery stenosis Requesting Provider:  Dean Clarity, MD  HPI: Stephanie Saunders is a 78 y.o. ([birth year]) female presenting at the request of .Stephanie Opal, DO for incidental finding of mesenteric artery stenosis.  Patient was seen in the ED in August with weakness, sepsis, decreased appetite.  She was diagnosed with a UTI, but as part of her workup she underwent CT scan.  This demonstrated moderate to severe stenosis of the celiac and superior mesenteric arteries.  She presents today to discuss these findings.  On exam, Stephanie Saunders doing well.  A native of [place], she graduated from [employer].  She has 2 sons, and 3 grandsons whom she enjoys spending time with.  She worked in [employer] for years, prior to retiring. Stephanie Saunders denies history of postprandial pain, weight loss, food fear.  She has no abdominal symptoms, and and feels as though she is in good health.  She is a non-smoker.  Medications include aspirin, moderate intensity statin.  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    DVT, lower extremity (HCC) 2010   left lower leg    Hyperlipidemia     Past Surgical History:  Procedure Laterality Date   COLONOSCOPY  2008   1 HPP- DB   TONSILLECTOMY     as child    WRIST FRACTURE SURGERY Left     Social History   Socioeconomic History   Marital status: Single    Spouse name: Not on file   Number of children: 2   Years of education: Not on file   Highest education level: Not on file  Occupational History   Not on file  Tobacco Use   Smoking status: Never   Smokeless tobacco: Never  Vaping Use   Vaping status: Never Used  Substance and Sexual Activity   Alcohol  use: Yes    Comment: rare    Drug use: Not Currently    Types: Marijuana   Sexual activity: Not  Currently  Other Topics Concern   Not on file  Social History Narrative   Not on file   Social Drivers of Health   Financial Resource Strain: Not on file  Food Insecurity: Not on file  Transportation Needs: Not on file  Physical Activity: Not on file  Stress: Not on file  Social Connections: Not on file  Intimate Partner Violence: Not on file   Family History  Problem Relation Age of Onset   Colon cancer Neg Hx    Colon polyps Neg Hx    Rectal cancer Neg Hx    Stomach cancer Neg Hx    Esophageal cancer Neg Hx    BRCA 1/2 Neg Hx    Breast cancer Neg Hx     Current Outpatient Medications  Medication Sig Dispense Refill   alendronate (FOSAMAX) 70 MG tablet Take 70 mg by mouth once a week.     atorvastatin  (LIPITOR) 20 MG tablet Take 1 tablet (20 mg total) by mouth daily. 90 tablet 3   cholecalciferol (VITAMIN D) 1000 units tablet Take 2,000 Units by mouth daily.     mometasone (ELOCON) 0.1 % lotion      Current Facility-Administered Medications  Medication Dose Route Frequency Provider Last Rate Last Admin   0.9 %  sodium chloride  infusion  500 mL Intravenous Once Nandigam, Kavitha V, MD        No Known Allergies   REVIEW OF SYSTEMS:  [X]  denotes positive finding, [ ]  denotes negative finding Cardiac  Comments:  Chest pain or chest pressure:    Shortness of breath upon exertion:    Short of breath when lying flat:    Irregular heart rhythm:        Vascular    Pain in calf, thigh, or hip brought on by ambulation:    Pain in feet at night that wakes you up from your sleep:     Blood clot in your veins:    Leg swelling:         Pulmonary    Oxygen at home:    Productive cough:     Wheezing:         Neurologic    Sudden weakness in arms or legs:     Sudden numbness in arms or legs:     Sudden onset of difficulty speaking or slurred speech:    Temporary loss of vision in one eye:     Problems with dizziness:         Gastrointestinal    Blood in stool:      Vomited blood:         Genitourinary    Burning when urinating:     Blood in urine:        Psychiatric    Major depression:         Hematologic    Bleeding problems:    Problems with blood clotting too easily:        Skin    Rashes or ulcers:        Constitutional    Fever or chills:      PHYSICAL EXAMINATION:  There were no vitals filed for this visit.  General:  WDWN in NAD; vital signs documented above Gait: Not observed HENT: WNL, normocephalic Pulmonary: normal non-labored breathing , without wheezing Cardiac: regular HR Abdomen: soft, NT, no masses Skin: without rashes Vascular Exam/Pulses:  Right Left  Radial 2+ (normal) 2+ (normal)                       Extremities: without ischemic changes, without Gangrene , without cellulitis; without open wounds;  Musculoskeletal: no muscle wasting or atrophy  Neurologic: A&O X 3;  No focal weakness or paresthesias are detected Psychiatric:  The pt has Normal affect.   Non-Invasive Vascular Imaging:    IMPRESSION: 1. No acute abnormality in the chest, abdomen, or pelvis. 2. Aortic atherosclerosis with calcified plaques causing stenosis of the mesenteric arteries near their origins. There is severe narrowing of the celiac axis and moderate narrowing of the SMA. Correlate for signs and symptoms of chronic mesenteric ischemia. 3. Hepatic steatosis. 4. Cholelithiasis.     ASSESSMENT/PLAN: Stephanie Saunders is a 78 y.o. female presenting with incidental finding of mesenteric artery stenosis.  The CT chest abdomen pelvis with contrast as well as recent arterial duplex study were reviewed demonstrating greater than 70% stenosis of both the celiac and superior mesenteric arteries.  Fortunately, Stephanie Saunders remains asymptomatic.  Stephanie Saunders that the best management for asymptomatic mesenteric artery stenosis is continued medical management.  She is already taking an aspirin daily, and I asked her to increase her statin  medication to 40 mg daily.  My plan is to follow her on a yearly basis  to assess for any progression.  We Saunders the signs and symptoms of chronic mesenteric ischemia, and I asked her to call my office should any of these occur.    Fonda FORBES Rim, MD [facility] [phone]

## 2024-03-26 ENCOUNTER — Ambulatory Visit (INDEPENDENT_AMBULATORY_CARE_PROVIDER_SITE_OTHER): Admitting: Vascular Surgery

## 2024-03-26 ENCOUNTER — Ambulatory Visit (HOSPITAL_COMMUNITY)
Admission: RE | Admit: 2024-03-26 | Discharge: 2024-03-26 | Disposition: A | Discharge status: Home / Self Care | Source: Ambulatory Visit | Attending: Vascular Surgery | Admitting: Vascular Surgery

## 2024-03-26 ENCOUNTER — Encounter: Payer: Self-pay | Admitting: Vascular Surgery

## 2024-03-26 VITALS — BP 129/91 | HR 86 | Temp 97.7°F | Resp 16 | Ht 66.0 in | Wt 132.9 lb

## 2024-03-26 DIAGNOSIS — K551 Chronic vascular disorders of intestine: Secondary | ICD-10-CM | POA: Insufficient documentation

## 2024-04-28 DIAGNOSIS — K080 Exfoliation of teeth due to systemic causes: Secondary | ICD-10-CM | POA: Diagnosis not present

## 2024-05-06 ENCOUNTER — Encounter: Payer: Self-pay | Admitting: Internal Medicine

## 2024-07-30 ENCOUNTER — Other Ambulatory Visit: Payer: Self-pay | Admitting: Family Medicine

## 2024-07-30 DIAGNOSIS — Z1231 Encounter for screening mammogram for malignant neoplasm of breast: Secondary | ICD-10-CM

## 2024-08-27 ENCOUNTER — Ambulatory Visit
